# Patient Record
Sex: Male | Born: 1950 | Race: White | Hispanic: No | Marital: Married | Smoking: Former smoker
Health system: Southern US, Community
[De-identification: ages and names within clinical notes are randomized; demographics above are authoritative.]

## PROBLEM LIST (undated history)

## (undated) DIAGNOSIS — G600 Hereditary motor and sensory neuropathy: Secondary | ICD-10-CM

## (undated) DIAGNOSIS — E119 Type 2 diabetes mellitus without complications: Secondary | ICD-10-CM

## (undated) DIAGNOSIS — I10 Essential (primary) hypertension: Secondary | ICD-10-CM

## (undated) DIAGNOSIS — H409 Unspecified glaucoma: Secondary | ICD-10-CM

## (undated) DIAGNOSIS — I509 Heart failure, unspecified: Secondary | ICD-10-CM

## (undated) DIAGNOSIS — I639 Cerebral infarction, unspecified: Secondary | ICD-10-CM

## (undated) HISTORY — PX: TOE AMPUTATION: SHX809

## (undated) HISTORY — PX: EYE SURGERY: SHX253

## (undated) HISTORY — PX: OTHER SURGICAL HISTORY: SHX169

## (undated) HISTORY — PX: CORONARY ARTERY BYPASS GRAFT: SHX141

## (undated) HISTORY — PX: CARDIAC SURGERY: SHX584

---

## 2005-03-22 ENCOUNTER — Inpatient Hospital Stay: Payer: Self-pay | Admitting: Internal Medicine

## 2005-03-22 ENCOUNTER — Other Ambulatory Visit: Payer: Self-pay

## 2005-03-27 ENCOUNTER — Other Ambulatory Visit: Payer: Self-pay

## 2005-03-28 ENCOUNTER — Ambulatory Visit: Payer: Self-pay | Admitting: Urology

## 2005-04-08 ENCOUNTER — Ambulatory Visit: Payer: Self-pay | Admitting: Urology

## 2005-04-18 ENCOUNTER — Ambulatory Visit: Payer: Self-pay | Admitting: Urology

## 2005-10-31 ENCOUNTER — Emergency Department: Payer: Self-pay | Admitting: General Practice

## 2005-11-01 ENCOUNTER — Other Ambulatory Visit: Payer: Self-pay

## 2005-11-01 ENCOUNTER — Inpatient Hospital Stay: Payer: Self-pay

## 2008-10-24 ENCOUNTER — Emergency Department: Payer: Self-pay | Admitting: Emergency Medicine

## 2010-05-24 ENCOUNTER — Other Ambulatory Visit: Payer: Self-pay | Admitting: Ophthalmology

## 2010-12-25 ENCOUNTER — Emergency Department: Payer: Self-pay | Admitting: Emergency Medicine

## 2012-01-07 ENCOUNTER — Observation Stay: Payer: Self-pay | Admitting: Internal Medicine

## 2012-01-07 LAB — BASIC METABOLIC PANEL
Anion Gap: 9 (ref 7–16)
BUN: 22 mg/dL — ABNORMAL HIGH (ref 7–18)
Calcium, Total: 9.4 mg/dL (ref 8.5–10.1)
Chloride: 92 mmol/L — ABNORMAL LOW (ref 98–107)
EGFR (African American): 58 — ABNORMAL LOW
EGFR (Non-African Amer.): 50 — ABNORMAL LOW
Glucose: 277 mg/dL — ABNORMAL HIGH (ref 65–99)
Osmolality: 281 (ref 275–301)
Potassium: 4.1 mmol/L (ref 3.5–5.1)

## 2012-01-07 LAB — CBC
HCT: 48.9 % (ref 40.0–52.0)
MCH: 33.1 pg (ref 26.0–34.0)
MCV: 94 fL (ref 80–100)
Platelet: 131 10*3/uL — ABNORMAL LOW (ref 150–440)
RDW: 13.2 % (ref 11.5–14.5)

## 2012-01-07 LAB — HEPATIC FUNCTION PANEL A (ARMC)
Alkaline Phosphatase: 104 U/L (ref 50–136)
Bilirubin, Direct: 0.5 mg/dL — ABNORMAL HIGH (ref 0.00–0.20)

## 2012-01-07 LAB — CK TOTAL AND CKMB (NOT AT ARMC)
CK, Total: 128 U/L (ref 35–232)
CK, Total: 151 U/L (ref 35–232)
CK-MB: 0.6 ng/mL (ref 0.5–3.6)
CK-MB: 0.5 ng/mL (ref 0.5–3.6)

## 2012-01-07 LAB — TSH: Thyroid Stimulating Horm: 1.45 u[IU]/mL

## 2012-01-07 LAB — TROPONIN I: Troponin-I: <0.02 ng/mL

## 2012-01-08 LAB — BASIC METABOLIC PANEL
Anion Gap: 5 — ABNORMAL LOW (ref 7–16)
Co2: 34 mmol/L — ABNORMAL HIGH (ref 21–32)
Creatinine: 1.4 mg/dL — ABNORMAL HIGH (ref 0.60–1.30)
EGFR (African American): >60
Glucose: 176 mg/dL — ABNORMAL HIGH (ref 65–99)
Osmolality: 280 (ref 275–301)
Sodium: 136 mmol/L (ref 136–145)

## 2012-01-08 LAB — CBC WITH DIFFERENTIAL/PLATELET
Basophil #: 0.1 10*3/uL (ref 0.0–0.1)
Eosinophil #: 0 10*3/uL (ref 0.0–0.7)
Lymphocyte %: 9.3 %
MCH: 32.2 pg (ref 26.0–34.0)
MCHC: 34.1 g/dL (ref 32.0–36.0)
MCV: 94 fL (ref 80–100)
Monocyte %: 9.4 %
Neutrophil #: 14.2 10*3/uL — ABNORMAL HIGH (ref 1.4–6.5)
Neutrophil %: 80.6 %
Platelet: 161 10*3/uL (ref 150–440)
RBC: 4.43 10*6/uL (ref 4.40–5.90)
RDW: 12.4 % (ref 11.5–14.5)
WBC: 17.5 10*3/uL — ABNORMAL HIGH (ref 3.8–10.6)

## 2012-01-08 LAB — MAGNESIUM: Magnesium: 2.1 mg/dL

## 2012-01-09 LAB — CBC WITH DIFFERENTIAL/PLATELET
Basophil #: 0.1 10*3/uL (ref 0.0–0.1)
Eosinophil #: 0.1 10*3/uL (ref 0.0–0.7)
HCT: 37.8 % — ABNORMAL LOW (ref 40.0–52.0)
Lymphocyte #: 1.8 10*3/uL (ref 1.0–3.6)
Lymphocyte %: 18.8 %
MCV: 94 fL (ref 80–100)
Monocyte %: 9.2 %
Neutrophil %: 70 %
Platelet: 148 10*3/uL — ABNORMAL LOW (ref 150–440)
RBC: 4.02 10*6/uL — ABNORMAL LOW (ref 4.40–5.90)
RDW: 12.5 % (ref 11.5–14.5)

## 2012-01-09 LAB — BASIC METABOLIC PANEL
Anion Gap: 5 — ABNORMAL LOW (ref 7–16)
Calcium, Total: 8.8 mg/dL (ref 8.5–10.1)
Chloride: 99 mmol/L (ref 98–107)
EGFR (Non-African Amer.): 60 — ABNORMAL LOW
Osmolality: 278 (ref 275–301)
Potassium: 4.1 mmol/L (ref 3.5–5.1)

## 2012-02-09 ENCOUNTER — Emergency Department: Payer: Self-pay | Admitting: Emergency Medicine

## 2012-02-09 LAB — BASIC METABOLIC PANEL
BUN: 14 mg/dL (ref 7–18)
Calcium, Total: 9.1 mg/dL (ref 8.5–10.1)
Chloride: 98 mmol/L (ref 98–107)
Co2: 29 mmol/L (ref 21–32)
Creatinine: 1.02 mg/dL (ref 0.60–1.30)
Glucose: 238 mg/dL — ABNORMAL HIGH (ref 65–99)
Osmolality: 280 (ref 275–301)
Potassium: 3.9 mmol/L (ref 3.5–5.1)
Sodium: 136 mmol/L (ref 136–145)

## 2012-02-09 LAB — CBC
MCH: 32.6 pg (ref 26.0–34.0)
MCHC: 35.2 g/dL (ref 32.0–36.0)
MCV: 93 fL (ref 80–100)
Platelet: 120 10*3/uL — ABNORMAL LOW (ref 150–440)
RBC: 4.18 10*6/uL — ABNORMAL LOW (ref 4.40–5.90)
RDW: 12.4 % (ref 11.5–14.5)

## 2012-02-15 LAB — CULTURE, BLOOD (SINGLE)

## 2013-11-24 ENCOUNTER — Ambulatory Visit: Payer: Self-pay | Admitting: Ophthalmology

## 2014-03-25 ENCOUNTER — Ambulatory Visit: Payer: Self-pay | Admitting: Gastroenterology

## 2014-09-06 NOTE — H&P (Signed)
PATIENT NAME:  Mark Armstrong, Mark Armstrong MR#:  [id number] DATE OF BIRTH:  12/23/1950  DATE OF ADMISSION:  01/07/2012  PRIMARY CARE PHYSICIAN: Delton Prairie, MD [facility])  History obtained from the patient, family at bedside, and case discussed with the ER physician. The patient's imaging studies and EKG are reviewed.   CHIEF COMPLAINT: Chest pain.   HISTORY OF PRESENTING ILLNESS: The patient is a 64 year old Caucasian male patient with history of coronary artery disease status post coronary artery bypass graft in 1999 along with congestive heart failure, diabetes, and hypertension who presents to the emergency room brought in by EMS after he complained of acute onset of mid sternal chest pain. The pain lasted two hours and resolved after the patient arrived to the emergency room where he got nitro medication. He mentions that he had one episode of cough and some greenish sputum with his pain. No aggravating or relieving factors for the pain. The pain did radiate to his back. He did not have any abdominal pain, nausea, vomiting, sweating, palpitations, or lightheadedness. The patient mentions that he had similar pain many years back but resolved on its own.   While the patient was being brought to the hospital by EMS, he did have what seems to be 5 to 10 seconds of ventricular tachycardia which was caught on a rhythm strip, but as per ER physician this did run for a longer time than was recorded. Presently the patient's heart rate is between 120 to 130, in normal sinus rhythm, and he is chest pain-free.   The patient used to follow-up with Dr. Gwen Pounds and Dr. Lady Gary of cardiology but has not seen them in over four years. He has not had any cardiology follow-up. He does not remember the last time he had a stress test or echocardiogram. Today his cardiac enzymes are normal. EKG shows no acute ST elevation. The patient is being admitted to the hospitalist service for chest pain, rule out acute coronary syndrome  under observation.   PAST MEDICAL HISTORY:  1. Bell's palsy.  2. Transient ischemic attack/cerebrovascular accident.   3. Hyperlipidemia.  4. Hypertension.  5. Insulin-dependent diabetes mellitus.  6. Peripheral neuropathy.  7. Chronic pain syndrome on high dose opioids.  8. Coronary artery disease status post coronary artery bypass graft in 1999.  9. Congestive heart failure, unknown if systolic or diastolic.  10. Charcot foot.  11. Diverticulosis.  12. Glaucoma.   PAST SURGICAL HISTORY:  1. Glaucoma surgery. 2. Knee surgery. 3. Circumcision. 4. Surgery for fracture of right foot.   ALLERGIES: Penicillin.   SOCIAL HISTORY: The patient lives at home with his wife. He has 20 pack-year smoking history but quit 20 years back. No alcohol and no illicit drugs. Ambulates on his own.   CODE STATUS: FULL CODE.   FAMILY HISTORY: Positive for congestive heart failure, chronic obstructive pulmonary disease, and diabetes in his father. Mother had diabetes and coronary artery disease and had coronary bypass.   HOME MEDICATIONS:  1. Allopurinol 100 mg oral once a day.  2. Amitriptyline 150 mg oral once a day.  3. Flexeril 10 mg one tablet orally as needed.  4. Ferrous sulfate 25 mg orally once a day.  5. Fish oil 1000 mg two capsules oral once a day.  6. Gemfibrozil 600 mg oral three times daily. 7. Glipizide 10 mg oral two tablets once a day.  8. Hydrocortisone topical application rectally as needed.  9. Lantus 50 units subcutaneous once a day.  10.  Lasix 40 mg orally one to two times a day.  11. Lisinopril 5 mg oral once a day.  12. Lyrica 100 mg oral three times daily.  13. Metformin 1000 mg oral twice a day. 14. MiraLax 1 capsule orally once a day.  15. Nexium 40 mg orally once a day.  16. Oxycodone extended-release 50 mg orally twice a day. 17. Percocet 5/325 mg one tablet oral four times daily. 18. Potassium citrate three tablets orally twice a day. 19. Simvastatin 80 mg  orally once a day.  20. Timolol 0.5% ophthalmic solution in the affected eye twice a day.  21. Vitamin C 500 mg oral twice a day.  22. Zolpidem 10 mg orally once a day at bedtime.   REVIEW OF THE SYSTEMS: CONSTITUTIONAL: Complains of no fatigue, weight loss or weight gain. EYES: No redness or pain. Does have history of glaucoma. ENT: Has some nasal congestion. No tinnitus or hearing loss. CARDIOVASCULAR: Complains of chest pain. No edema, PND, or orthopnea. RESPIRATORY: Had one episode of cough and greenish sputum. GASTROINTESTINAL: No abdominal pain, nausea, vomiting or diarrhea. MUSCULOSKELETAL: Has no arthritis or joint pains. SKIN: Has stasis changes in both his legs. No other ulcers. NEUROLOGIC: Has peripheral neuropathy. No focal weakness. HEMATOLOGIC/LYMPHATIC: No anemia or bleeding.   PHYSICAL EXAMINATION:   VITAL SIGNS: Temperature 98, pulse 128, respirations 16, blood pressure 122/94, and saturating 97% on room air.   GENERAL: Obese Caucasian male patient lying in bed comfortable, in no acute distress, conversational, cooperative with exam.   PSYCH: Alert and oriented x3. Mood and affect appropriate. Judgment intact.   HEENT: Atraumatic, normocephalic. Oral mucosa moist and pink. External ears and nose normal. No pallor. No icterus. Pupils are bilaterally equal and reactive to light.   NECK: Supple. No thyromegaly. No palpable lymph nodes. Trachea midline. No carotid bruit or JVD.   CARDIOVASCULAR: No chest wall tenderness. S1 and S2 heard without any murmurs. Peripheral pulses 2+. Has 1+ edema in the lower extremities.   RESPIRATORY: Good air entry on both sides. Normal work of breathing. Clear to auscultation on both sides.   GASTROINTESTINAL: Soft abdomen, nontender. Bowel sounds present. No hepatosplenomegaly palpable.   MUSCULOSKELETAL: No joint swelling, redness, or effusion of the large joints. Has a Charcot foot on the right side.   SKIN: Stasis changes in the lower  extremities with dry skin and excoriations. Warm and dry.   NEUROLOGICAL: Motor strength 5 out of 5 in upper extremities. Decreased sensation. Cranial nerves II through XII intact.   LABS/RADIOLOGIC STUDIES: Glucose 277, BUN 22, creatinine 1.49, sodium 134, potassium 4.1, and bicarbonate 33. GFR 50. CK 128, CK-MB 0.6, and troponin less than 0.02. TSH 1.45. WBC 8.7, hemoglobin 17.2, and platelets 131.   EKG shows inferolateral Q waves with poor R wave progression, normal sinus rhythm, and heart rate of 127.   CT scan of the abdomen and pelvis showed right-sided nephrolithiasis without hydronephrosis, cholelithiasis, and indeterminate 7 mm nodule in the left lower lobe similar to prior study along with heterogenous opacity in lower lungs concerning for pneumonia or aspiration.   ASSESSMENT AND PLAN:  1. Atypical chest pain: The patient does complain of two hours of chest pain, which has resolved at this time. No clear etiology. Need to rule out acute coronary syndrome in this patient with significant coronary artery disease and no follow-up and the patient does not seem to have a stress test or any follow up since his bypass. We will get two more  sets of cardiac enzymes. The patient did see Dr. Gwen Pounds in the past. We will consult cardiology for further input. We will start the patient on low-dose beta blocker. The patient is on a statin with his history of coronary artery disease.  2. Nonsustained ventricular tachycardia: The patient does have normal electrolytes. We will need to place the patient on a telemetry floor. We will get a 2-D echocardiogram to assess LV function. The patient will be placed on a low-dose beta blocker.  3. Hypertension, well controlled: Continue medications.  4. Insulin-dependent diabetes mellitus: Continue the patient's insulin, add sliding scale insulin and diabetic diet.  5. Chronic congestive heart failure, unknown systolic or diastolic: The patient will be getting a 2-D  echocardiogram during the hospital stay.  6. Chronic kidney stage III, stable.  7. Pulmonary nodules, stable from prior study: Needs outpatient follow-up.  8. Cholelithiasis: The patient does not complain of any abdominal pain at this time. He needs to have further work-up if he does develop any abdominal pain or signs of gallbladder dysfunction.  9. Deep vein thrombosis prophylaxis with heparin.   CODE STATUS: FULL CODE.   TIME SPENT: Time spent today on this case was 55 minutes with more than 50% time spent in coordination of care. ____________________________ Molinda Bailiff. Sabriyah Wilcher, MD srs:slb D: 01/07/2012 14:52:11 ET T: 01/07/2012 15:29:49 ET JOB#: [id number]  cc: Wardell Heath R. Ruel Dimmick, MD, <Dictator> Vic Ripper. Mariana Kaufman, MD Orie Fisherman MD ELECTRONICALLY SIGNED 01/07/2012 21:53

## 2014-09-06 NOTE — Consult Note (Signed)
PATIENT NAME:  Mark Armstrong, [place]LAND MR#:  [id number] DATE OF BIRTH:  Jun 16, 1950  DATE OF CONSULTATION:  01/07/2012  REFERRING PHYSICIAN:  Milagros Loll, MD CONSULTING PHYSICIAN:  Marcina Millard, MD  PRIMARY CARE PHYSICIAN: Delton Prairie, MD  CHIEF COMPLAINT: "I thought I had a kidney stone."  REASON FOR CONSULTATION: Consultation is requested for evaluation of coronary artery disease and ventricular tachycardia.   HISTORY OF PRESENT ILLNESS: The patient is a 64 year old gentleman with known coronary artery disease status post bypass graft surgery with ischemic cardiomyopathy and history of congestive heart failure. The patient reports that early this morning he was experiencing symptoms that he felt like were similar to what he experienced previously with kidney stones. He denies chest pain. EMS was called. Rhythm strip revealed wide complex tachycardia, which was brief in nature. The patient was brought to the [facility] Emergency Room where EKG did not reveal any acute ischemic ST-T wave changes. Initial troponin was less than 0.02. The patient currently denies chest pain or shortness of breath.   PAST MEDICAL HISTORY:  1. Coronary artery disease status post bypass graft surgery at [facility] in 1999. 2. History of recurrent congestive heart failure.  3. Apparent ischemic cardiomyopathy.  4. Hypertension.  5. Hyperlipidemia.  6. Insulin requiring diabetes.  7. Peripheral neuropathy. 8. History of transient ischemic attack and prior cerebrovascular accident. 9. Charcot foot. 10. Bell's palsy.     MEDICATIONS:  1. Gemfibrozil 600 mg three times daily. 2. Lasix 40 mg 1 to 2 twice a day. 3. Lisinopril 5 mg daily. 4. Potassium 3 tablets twice a day. 5. Simvastatin 80 mg daily.  6. Allopurinol 100 mg daily.  7. Amitriptyline 150 mg daily.  8. Flexeril 10 mg daily p.r.n.  9. Ferrous sulfate 25 mg daily.  10. Fish oil capsule 1000 mg 2 capsules  daily.  11. Glipizide 20 mg daily.  12. Lantus 50 units subcutaneous daily. 13. Lyrica 100 mg three times daily. 14. Metformin 1000 mg twice a day. 15. MiraLax 1 capsule daily.  16. Nexium 40 mg daily.  17. Oxycodone 50 mg twice a day. 18. Percocet one four times daily.  19. Timolol eyedrops. 20. Vitamin C 500 mg twice a day. 21. Zolpidem 10 mg at bedtime.   SOCIAL HISTORY: The patient is married and resides with his wife. He has a 20 pack-year tobacco abuse history. His mother with advanced Alzheimer's lives at home.   FAMILY HISTORY: Notable for mother with bypass graft surgery.   REVIEW OF SYSTEMS: CONSTITUTIONAL: No fever or chills. EYES: No blurry vision. EARS: No hearing loss. RESPIRATORY: No shortness of breath. CARDIOVASCULAR: No chest pain. GASTROINTESTINAL: No nausea, vomiting, diarrhea, or constipation. GU: No dysuria or hematuria, although the patient had flank discomfort with weakness and dizziness which is similar to what he experienced with prior kidney stones. MUSCULOSKELETAL: No arthralgias or myalgias. INTEGUMENTARY: No rash. NEUROLOGICAL: No focal muscle weakness or numbness. The patient does have neuropathy in both feet. PSYCHOLOGICAL: No depression or anxiety.     PHYSICAL EXAMINATION:   VITAL SIGNS: Blood pressure 102/67, pulse 100 and irregular, respirations 20, temperature 100.4, and pulse oximetry 94%.   HEENT: Pupils are equal and reactive to light and accommodation.   NECK: Supple without thyromegaly.   LUNGS: Clear.   HEART: Normal jugular venous pressure. Normal point of maximal impulse. Regular rate and rhythm. Normal S1 and S2. No appreciable gallop, murmur, or rub.   ABDOMEN: Soft and nontender.  EXTREMITIES: There is 1 to 2+ bilateral pedal edema with venous stasis changes on both lower extremities.   MUSCULOSKELETAL: Normal muscle tone.   NEUROLOGIC: The patient is alert and oriented x3. Motor and sensory are both grossly intact.    IMPRESSION: A 64 year old gentleman with known coronary artery disease status post bypass graft surgery with ischemic cardiomyopathy who presents with symptoms reflective of prior kidney stones and has ruled out for myocardial infarction by CPK isoenzymes and troponin. The patient did have a rhythm strip which revealed wide complex tachycardia, likely ventricular tachycardia that was nonsustained. The patient has had no prior history of presyncope or syncope.   RECOMMENDATIONS:  1. Continue present medications. 2. Defer full dose anticoagulation.  3. Agree with metoprolol. 4. Lexiscan sestamibi study to rule out myocardial ischemia.      5. 2-D echocardiogram to evaluate left ventricular function.  6. Further recommendations pending Lexiscan sestamibi results and echocardiogram.  ____________________________ Marcina MillardAlexander Cyndie Woodbeck, MD ap:slb D: 01/07/2012 17:12:50 ET T: 01/07/2012 17:43:58 ET JOB#: [id number]  cc: Marcina MillardAlexander Else Habermann, MD, <Dictator> Marcina MillardALEXANDER Sophi Calligan MD ELECTRONICALLY SIGNED 01/22/2012 9:00

## 2014-09-06 NOTE — Consult Note (Signed)
Brief Consult Note: Diagnosis: Vague CP, patient actually denies CP, WCT, probable nonsustained VY, no h/o syncope.   Patient was seen by consultant.   Consult note dictated.   Comments: REC  Agree with current therapy, defer full dose anticoagulation, Lexi-scan Myoview, echo.  Electronic Signatures: Marcina MillardParaschos, Joanie Duprey (MD)  (Signed 20-Aug-13 17:14)  Authored: Brief Consult Note   Last Updated: 20-Aug-13 17:14 by Marcina MillardParaschos, Tyrea Froberg (MD)

## 2014-09-06 NOTE — Discharge Summary (Signed)
PATIENT NAME:  Mark Armstrong, Mark Armstrong MR#:  [id number] DATE OF BIRTH:  11-Dec-1950  DATE OF ADMISSION:  01/07/2012 DATE OF DISCHARGE:  01/09/2012  PRIMARY CARE PHYSICIAN: None local  CONSULTING PHYSICIAN:   Marcina MillardAlexander Paraschos, MD    DISCHARGE DIAGNOSES:  1. Atypical chest pain. 2. Dehydration with chronic kidney disease, stage 3.  3. Cholelithiasis and possible cholecystitis.  4. Congestive heart failure with possible diastolic dysfunction, stable.  5. Hypertension.  6. Diabetes.  7. Right side nephrolithiasis.   PROCEDURES: None.   CONDITION: Stable.   DISCHARGE MEDICATIONS: [facility] Phys[facility].  NOTE: Stop the following medications:  1. Potassium 3 tablets p.o. b.i.Armstrong.  2. Lisinopril 5 mg p.o. daily.  3. Lasix 40 mg p.o. 1 to 2 times a day. 4. Percocet 5/325 mg p.o. q.i.Armstrong.   The reason for stopping these medications is low blood pressure.   DIET: Low sodium, low fat, low cholesterol ADA diet.   ACTIVITY: As tolerated.    FOLLOW-UP CARE:  1. Follow up with primary care physician within 1 week.  2. Follow up with Dr. Darrold JunkerParaschos within 1 to 2 weeks   REASON FOR ADMISSION: Chest pain.   HOSPITAL COURSE: The patient is a 64 year old Caucasian male with a history of coronary artery disease, status post CABG, congestive heart failure, diabetes, hypertension, presented to the Emergency Department due to acute onset of mild substernal chest pain for two hours with radiation to back. In addition, the patient's heart rate was about 120 to 130. EKG showed no acute ST elevation. Cardiac enzymes were normal. For a detailed History and Physical Examination, please refer to the admission note dictated by Dr. Elpidio AnisSudini. The patient was admitted for:  1. Atypical chest pain: CT scan of the abdomen and pelvis showed right side nephrolithiasis without hydronephrosis, cholelithiasis. After admission, the patient has been treated with a low dose beta blocker.  Echocardiogram showed normal ejection fraction. Stress test was normal, so acute coronary syndrome was ruled out.  2. Possible cholecystitis: After admission, the patient had no chest pain but complained of right upper quadrant tenderness and right flank pain which was mild. In addition, the patient's WBC  increased to 17.5, suspect cholecystitis; so we ordered a HIDA scan, but the HIDA scan could not be done until tomorrow. We started Levaquin yesterday. The patient's white count decreased to normal range today. In addition, the patient has no chest pain or abdominal pain, nausea or vomiting.  3. Low blood pressure: Since the patient's blood pressure is on the lower side, we held Lasix, lisinopril and potassium. The patient's blood pressure is about 100, but the patient has no complaints.   The patient is clinically stable and will be discharged to home today. The patient needs to follow up with his primary care physician for possible cholecystitis work-up, including HIDA scan. In addition, the patient may resume hypertension medications depending on the patient's blood pressure, but he needs follow up with his primary care physician.   I discussed the patient's discharge plan with the patient, the patient's wife, nurse, and the case manager.      TIME SPENT: About 42 minutes.  ____________________________ Shaune PollackQing Elfriede Bonini, MD qc:cbb Armstrong: 01/09/2012 17:14:20 ET T: 01/10/2012 10:51:10 ET JOB#: [id number]  cc: Shaune PollackQing Letisia Schwalb, MD, <Dictator> Shaune PollackQING Janice Bodine MD ELECTRONICALLY SIGNED 01/14/2012 16:42

## 2014-12-14 DIAGNOSIS — Y9389 Activity, other specified: Secondary | ICD-10-CM | POA: Insufficient documentation

## 2014-12-14 DIAGNOSIS — W260XXA Contact with knife, initial encounter: Secondary | ICD-10-CM | POA: Insufficient documentation

## 2014-12-14 DIAGNOSIS — S61211A Laceration without foreign body of left index finger without damage to nail, initial encounter: Secondary | ICD-10-CM | POA: Diagnosis not present

## 2014-12-14 DIAGNOSIS — Y9289 Other specified places as the place of occurrence of the external cause: Secondary | ICD-10-CM | POA: Insufficient documentation

## 2014-12-14 DIAGNOSIS — Y998 Other external cause status: Secondary | ICD-10-CM | POA: Insufficient documentation

## 2014-12-14 DIAGNOSIS — Z23 Encounter for immunization: Secondary | ICD-10-CM | POA: Diagnosis not present

## 2014-12-14 NOTE — ED Notes (Addendum)
Patient ambulatory to triage with steady gait, without difficulty or distress noted; pt reports slicing cookie dough and cut left index finger; approx 1" lac noted with active bleeding; saline soaked gauze applied with pressure

## 2014-12-15 ENCOUNTER — Emergency Department
Admission: EM | Admit: 2014-12-15 | Discharge: 2014-12-15 | Disposition: A | Discharge status: Home / Self Care | Payer: Medicare Other | Attending: Emergency Medicine | Admitting: Emergency Medicine

## 2014-12-15 DIAGNOSIS — S61211A Laceration without foreign body of left index finger without damage to nail, initial encounter: Secondary | ICD-10-CM

## 2014-12-15 MED ORDER — TETANUS-DIPHTH-ACELL PERTUSSIS 5-2.5-18.5 LF-MCG/0.5 IM SUSP
0.5000 mL | Freq: Once | INTRAMUSCULAR | Status: AC
  Administered 2014-12-15: 0.5 mL via INTRAMUSCULAR
  Filled 2014-12-15: qty 0.5

## 2014-12-15 MED ORDER — DOXYCYCLINE HYCLATE 100 MG PO CAPS: 100.0000 mg | ORAL_CAPSULE | Freq: Two times a day (BID) | ORAL | Status: DC

## 2014-12-15 MED ORDER — LIDOCAINE HCL (PF) 1 % IJ SOLN
INTRAMUSCULAR | Status: AC
  Administered 2014-12-15: 5 mL
  Filled 2014-12-15: qty 5

## 2014-12-15 NOTE — ED Notes (Signed)
Right index finger sutured and dressed with gauze.

## 2014-12-15 NOTE — ED Provider Notes (Signed)
[facility] Emergency Department Provider Note  ____________________________________________  Time seen: Approximately 1:42 AM  I have reviewed the triage vital signs and the nursing notes.   HISTORY  Chief Complaint Laceration    HPI Mark Armstrong is a 64 y.o. male who was using a knife to cut food at about 11 PM when he sliced the tip of his left index finger. No pain. He states he has neuropathy in his hand and does not have sensation and then the tips of any of his fingers anymore. No other injury. States it bled a fair amount, but is now improved. His last tetanus is unknown.  No other injury. He did recently cut his hand with a knife last week which his wife was able to bandage for him and states this is healed up now.  Relevant that the patient doesn't history of diabetes and neuropathy in the tips of all fingers.  No past medical history on file.  There are no active problems to display for this patient.   No past surgical history on file.  Current Outpatient Rx  Name  Route  Sig  Dispense  Refill  . doxycycline (VIBRAMYCIN) 100 MG capsule   Oral   Take 1 capsule (100 mg total) by mouth 2 (two) times daily.   20 capsule   0     Allergies Penicillins  No family history on file.  Social History History  Substance Use Topics  . Smoking status: Not on file  . Smokeless tobacco: Not on file  . Alcohol Use: Not on file    Review of Systems Constitutional: No fever/chills Eyes: No visual changes. ENT: No sore throat. Cardiovascular: Denies chest pain. Respiratory: Denies shortness of breath. Gastrointestinal: No abdominal pain.  No nausea, no vomiting.  No diarrhea.  No constipation. Genitourinary: Negative for dysuria. Musculoskeletal: Negative for back pain. Skin: See history of present illness Neurological: Negative for headaches, focal weakness or numbness.  10-point ROS otherwise  negative.  ____________________________________________   PHYSICAL EXAM:  VITAL SIGNS: ED Triage Vitals  Enc Vitals Group     BP 12/14/14 2344 137/62 mmHg     Pulse Rate 12/14/14 2344 52     Resp 12/14/14 2344 18     Temp 12/14/14 2344 98.3 F (36.8 C)     Temp Source 12/14/14 2344 Oral     SpO2 --      Weight 12/14/14 2344 199 lb (90.266 kg)     Height 12/14/14 2344  (1.854 m)     Head Cir --      Peak Flow --      Pain Score 12/14/14 2345 10     Pain Loc --      Pain Edu? --      Excl. in GC? --     Constitutional: Alert and oriented. Well appearing and in no acute distress. Eyes: Conjunctivae are normal. PERRL. EOMI. Head: Atraumatic. Nose: No congestion/rhinnorhea. Mouth/Throat: Mucous membranes are moist.  Oropharynx non-erythematous. Cardiovascular: Normal rate, regular rhythm.  Respiratory: Normal respiratory effort.  No retractions. Lungs CTAB. Gastrointestinal: No distention Musculoskeletal: Right hand atraumatic normal exam. Left hand atraumatic with normal exam except for a 1-1/2 cm laceration into the dermis over the distal tuft distal to the DIP. No nail bed injury. Bleeding is controlled with pressure. Patient was then taken and performed Water irrigation for approximately 5 minutes. The patient has no sensation in the distal tip, but this is true of all digits  and this is stable from neuropathy. Good flexion and extension of the left index finger. No other injury that hand. Capillary refill normal. Normal radial pulse. Median ulnar and radial motor sensory intact with exception to distal neuropathy. No joint effusions. Neurologic:  Normal speech and language. No gross focal neurologic deficits are appreciated. No gait instability. Skin:  Skin is warm, dry and intact. No rash noted. Psychiatric: Mood and affect are normal. Speech and behavior are normal.  ____________________________________________   LABS (all labs ordered are listed, but only abnormal  results are displayed)  Labs Reviewed - No data to display ____________________________________________  EKG   ____________________________________________  RADIOLOGY   ____________________________________________   PROCEDURES  Procedure(s) performed:  LACERATION REPAIR Performed by: Sharyn Creamer Authorized by: Sharyn Creamer Consent: Verbal consent obtained. Risks and benefits: risks, benefits and alternatives were discussed Consent given by: patient Patient identity confirmed: provided demographic data Prepped and Draped in normal sterile fashion Wound explored  Laceration Location: Left index hand  Laceration Length: 1.5 cm  No Foreign Bodies seen or palpated  No anesthesia was required as the patient had no sensation due to his existing neuropathy   Amount of cleaning: standard with pressure saline at bedside plus five-minute tap water irrigation   Skin closure: 4-0 prolene   Number of sutures: 6  Technique: Simple interrupted   Patient tolerance: Patient tolerated the procedure well with no immediate complications.  Injury explored and no foreign body identified. No tendinous injuries.  Critical Care performed: No  ____________________________________________   INITIAL IMPRESSION / ASSESSMENT AND PLAN / ED COURSE  Pertinent labs & imaging results that were available during my care of the patient were reviewed by me and considered in my medical decision making (see chart for details).  Skin lac distal left index. No other injury. Repaired.  Return precautions discussed and placed patient on doxycycline and updated tetanus. He will follow-up with his doctor and he and his wife were advised of wound care as well as close treatment recommendations and careful return precautions as he is a diabetic and is at risk for reinjury and infection despite significant wound care. ____________________________________________   FINAL CLINICAL IMPRESSION(S) / ED  DIAGNOSES  Final diagnoses:  Laceration of left index finger w/o foreign body w/o damage to nail, initial encounter      Sharyn Creamer, MD 12/15/14 [phone]

## 2014-12-15 NOTE — Discharge Instructions (Signed)
Laceration Care, Adult  You have been seen in the Emergency Department (ED) today for a laceration (cut).  Please keep the cut clean but do not submerge it in the water.  It has been repaired with staples or sutures that will need to be removed in about 7 to 10 days. Please follow up with your doctor, an urgent care, or return to the ED for suture removal.     Please follow up with your doctor as soon as possible regarding today's emergent visit.   Return to the ED or call your doctor if you notice any signs of infection such as fever, increased pain, increased redness, pus, or other symptoms that concern you.   A laceration is a cut or lesion that goes through all layers of the skin and into the tissue just beneath the skin. TREATMENT  Some lacerations may not require closure. Some lacerations may not be able to be closed due to an increased risk of infection. It is important to see your caregiver as soon as possible after an injury to minimize the risk of infection and maximize the opportunity for successful closure. If closure is appropriate, pain medicines may be given, if needed. The wound will be cleaned to help prevent infection. Your caregiver will use stitches (sutures), staples, wound glue (adhesive), or skin adhesive strips to repair the laceration. These tools bring the skin edges together to allow for faster healing and a better cosmetic outcome. However, all wounds will heal with a scar. Once the wound has healed, scarring can be minimized by covering the wound with sunscreen during the day for 1 full year. HOME CARE INSTRUCTIONS  For sutures or staples:  Keep the wound clean and dry.  If you were given a bandage (dressing), you should change it at least once a day. Also, change the dressing if it becomes wet or dirty, or as directed by your caregiver.  Wash the wound with soap and water 2 times a day. Rinse the wound off with water to remove all soap. Pat the wound dry with a  clean towel.  After cleaning, apply a thin layer of the antibiotic ointment as recommended by your caregiver. This will help prevent infection and keep the dressing from sticking.  You may shower as usual after the first 24 hours. Do not soak the wound in water until the sutures are removed.  Only take over-the-counter or prescription medicines for pain, discomfort, or fever as directed by your caregiver.  Get your sutures or staples removed as directed by your caregiver. For skin adhesive strips:  Keep the wound clean and dry.  Do not get the skin adhesive strips wet. You may bathe carefully, using caution to keep the wound dry.  If the wound gets wet, pat it dry with a clean towel.  Skin adhesive strips will fall off on their own. You may trim the strips as the wound heals. Do not remove skin adhesive strips that are still stuck to the wound. They will fall off in time. For wound adhesive:  You may briefly wet your wound in the shower or bath. Do not soak or scrub the wound. Do not swim. Avoid periods of heavy perspiration until the skin adhesive has fallen off on its own. After showering or bathing, gently pat the wound dry with a clean towel.  Do not apply liquid medicine, cream medicine, or ointment medicine to your wound while the skin adhesive is in place. This may loosen the film before  your wound is healed.  If a dressing is placed over the wound, be careful not to apply tape directly over the skin adhesive. This may cause the adhesive to be pulled off before the wound is healed.  Avoid prolonged exposure to sunlight or tanning lamps while the skin adhesive is in place. Exposure to ultraviolet light in the first year will darken the scar.  The skin adhesive will usually remain in place for 5 to 10 days, then naturally fall off the skin. Do not pick at the adhesive film. You may need a tetanus shot if:  You cannot remember when you had your last tetanus shot.  You have never  had a tetanus shot. If you get a tetanus shot, your arm may swell, get red, and feel warm to the touch. This is common and not a problem. If you need a tetanus shot and you choose not to have one, there is a rare chance of getting tetanus. Sickness from tetanus can be serious. SEEK MEDICAL CARE IF:   You have redness, swelling, or increasing pain in the wound.  You see a red line that goes away from the wound.  You have yellowish-white fluid (pus) coming from the wound.  You have a fever.  You notice a bad smell coming from the wound or dressing.  Your wound breaks open before or after sutures have been removed.  You notice something coming out of the wound such as wood or glass.  Your wound is on your hand or foot and you cannot move a finger or toe. SEEK IMMEDIATE MEDICAL CARE IF:   Your pain is not controlled with prescribed medicine.  You have severe swelling around the wound causing pain and numbness or a change in color in your arm, hand, leg, or foot.  Your wound splits open and starts bleeding.  You have worsening numbness, weakness, or loss of function of any joint around or beyond the wound.  You develop painful lumps near the wound or on the skin anywhere on your body. MAKE SURE YOU:   Understand these instructions.  Will watch your condition.  Will get help right away if you are not doing well or get worse. Document Released: 05/06/2005 Document Revised: 07/29/2011 Document Reviewed: 10/30/2010 [facility] Patient Information 2015 [place], [place]. This information is not intended to replace advice given to you by your health care provider. Make sure you discuss any questions you have with your health care provider.

## 2014-12-15 NOTE — ED Notes (Signed)
PT HAS LACERATION TO LEFT INDEX FINGER.  CUT FINGER WITH A KNIFE TONIGHT.  BLEEDING CONTROLLED.

## 2015-03-10 ENCOUNTER — Emergency Department: Payer: Medicare Other

## 2015-03-10 ENCOUNTER — Encounter: Payer: Self-pay | Admitting: Emergency Medicine

## 2015-03-10 ENCOUNTER — Emergency Department
Admission: EM | Admit: 2015-03-10 | Discharge: 2015-03-10 | Disposition: A | Discharge status: Home / Self Care | Payer: Medicare Other | Attending: Emergency Medicine | Admitting: Emergency Medicine

## 2015-03-10 DIAGNOSIS — I10 Essential (primary) hypertension: Secondary | ICD-10-CM | POA: Diagnosis not present

## 2015-03-10 DIAGNOSIS — Y9289 Other specified places as the place of occurrence of the external cause: Secondary | ICD-10-CM | POA: Insufficient documentation

## 2015-03-10 DIAGNOSIS — S3992XA Unspecified injury of lower back, initial encounter: Secondary | ICD-10-CM | POA: Diagnosis not present

## 2015-03-10 DIAGNOSIS — Z87891 Personal history of nicotine dependence: Secondary | ICD-10-CM | POA: Diagnosis not present

## 2015-03-10 DIAGNOSIS — S0990XA Unspecified injury of head, initial encounter: Secondary | ICD-10-CM | POA: Diagnosis present

## 2015-03-10 DIAGNOSIS — T148XXA Other injury of unspecified body region, initial encounter: Secondary | ICD-10-CM

## 2015-03-10 DIAGNOSIS — S50312A Abrasion of left elbow, initial encounter: Secondary | ICD-10-CM | POA: Insufficient documentation

## 2015-03-10 DIAGNOSIS — E119 Type 2 diabetes mellitus without complications: Secondary | ICD-10-CM | POA: Insufficient documentation

## 2015-03-10 DIAGNOSIS — S0003XA Contusion of scalp, initial encounter: Secondary | ICD-10-CM

## 2015-03-10 DIAGNOSIS — Y9389 Activity, other specified: Secondary | ICD-10-CM | POA: Insufficient documentation

## 2015-03-10 DIAGNOSIS — W108XXA Fall (on) (from) other stairs and steps, initial encounter: Secondary | ICD-10-CM | POA: Diagnosis not present

## 2015-03-10 DIAGNOSIS — S79911A Unspecified injury of right hip, initial encounter: Secondary | ICD-10-CM | POA: Diagnosis not present

## 2015-03-10 DIAGNOSIS — Z880 Allergy status to penicillin: Secondary | ICD-10-CM | POA: Diagnosis not present

## 2015-03-10 DIAGNOSIS — Y998 Other external cause status: Secondary | ICD-10-CM | POA: Diagnosis not present

## 2015-03-10 HISTORY — DX: Unspecified glaucoma: H40.9

## 2015-03-10 HISTORY — DX: Heart failure, unspecified: I50.9

## 2015-03-10 HISTORY — DX: Hereditary motor and sensory neuropathy: G60.0

## 2015-03-10 HISTORY — DX: Type 2 diabetes mellitus without complications: E11.9

## 2015-03-10 HISTORY — DX: Cerebral infarction, unspecified: I63.9

## 2015-03-10 HISTORY — DX: Essential (primary) hypertension: I10

## 2015-03-10 MED ORDER — BACITRACIN ZINC 500 UNIT/GM EX OINT
TOPICAL_OINTMENT | CUTANEOUS | Status: AC
  Filled 2015-03-10: qty 0.9

## 2015-03-10 MED ORDER — OXYCODONE-ACETAMINOPHEN 5-325 MG PO TABS
1.0000 | ORAL_TABLET | Freq: Once | ORAL | Status: AC
  Administered 2015-03-10: 1 via ORAL
  Filled 2015-03-10: qty 1

## 2015-03-10 NOTE — ED Notes (Signed)
Unable to pull up pt's discharge signature page, pt was not able to sign

## 2015-03-10 NOTE — Discharge Instructions (Signed)
Facial or Scalp Contusion ° A facial or scalp contusion is a deep bruise on the face or head. Contusions happen when an injury causes bleeding under the skin. Signs of bruising include pain, puffiness (swelling), and discolored skin. The contusion may turn blue, purple, or yellow. °HOME CARE °· Only take medicines as told by your doctor. °· Put ice on the injured area. °¨ Put ice in a plastic bag. °¨ Place a towel between your skin and the bag. °¨ Leave the ice on for 20 minutes, 2-3 times a day. °GET HELP IF: °· You have bite problems. °· You have pain when chewing. °· You are worried about your face not healing normally. °GET HELP RIGHT AWAY IF:  °· You have severe pain or a headache and medicine does not help. °· You are very tired or confused, or your personality changes. °· You throw up (vomit). °· You have a nosebleed that will not stop. °· You see two of everything (double vision) or have blurry vision. °· You have fluid coming from your nose or ear. °· You have problems walking or using your arms or legs. °MAKE SURE YOU:  °· Understand these instructions. °· Will watch your condition. °· Will get help right away if you are not doing well or get worse. °  °This information is not intended to replace advice given to you by your health care provider. Make sure you discuss any questions you have with your health care provider. °  °Document Released: 04/25/2011 Document Revised: 05/27/2014 Document Reviewed: 12/17/2012 °Elsevier Interactive Patient Education ©2016 [employer] ° °

## 2015-03-10 NOTE — ED Provider Notes (Signed)
[facility] Emergency Department Provider Note  ____________________________________________  Time seen:   I have reviewed the triage vital signs and the nursing notes.   HISTORY  Chief Complaint Fall; Head Injury; and Back Pain     HPI Mark Armstrong is a 64 y.o. male presents with history of accidental fall down approximately 6 steps while chasing his cat tonight. Patient states that he struck his head during the fall however did not have any loss of consciousness. Patient admits to abrasion to his left elbow. In addition patient has right hip and low back pain.     Past Medical History  Diagnosis Date  . Stroke (HCC)   . CHF (congestive heart failure) (HCC)   . Diabetes mellitus without complication (HCC)   . Hypertension   . 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 Glaucoma     There are no active problems to display for this patient.   Past Surgical History  Procedure Laterality Date  . Cardiac surgery    . Eye surgery      Current Outpatient Rx  Name  Route  Sig  Dispense  Refill  . doxycycline (VIBRAMYCIN) 100 MG capsule   Oral   Take 1 capsule (100 mg total) by mouth 2 (two) times daily.   20 capsule   0     Allergies Penicillins  History reviewed. No pertinent family history.  Social History Social History  Substance Use Topics  . Smoking status: Former Games developermoker  . Smokeless tobacco: Never Used  . Alcohol Use: No    Review of Systems  Constitutional: Negative for fever. Eyes: Negative for visual changes. ENT: Negative for sore throat. Cardiovascular: Negative for chest pain. Respiratory: Negative for shortness of breath. Gastrointestinal: Negative for abdominal pain, vomiting and diarrhea. Genitourinary: Negative for dysuria. Musculoskeletal: Positive for back pain. Skin: Negative for rash. Neurological: Negative for headaches, focal weakness or numbness.   10-point ROS otherwise  negative.  ____________________________________________   PHYSICAL EXAM:  VITAL SIGNS: ED Triage Vitals  Enc Vitals Group     BP 03/10/15 0523 158/64 mmHg     Pulse Rate 03/10/15 0523 50     Resp 03/10/15 0523 18     Temp 03/10/15 0523 97.8 F (36.6 C)     Temp Source 03/10/15 0523 Oral     SpO2 03/10/15 0523 99 %     Weight 03/10/15 0523 197 lb (89.359 kg)     Height 03/10/15 0523 5\' 11"  (1.803 m)     Head Cir --      Peak Flow --      Pain Score --      Pain Loc --      Pain Edu? --      Excl. in GC? --      Constitutional: Alert and oriented. Well appearing and in no distress. Eyes: Conjunctivae are normal. PERRL. Normal extraocular movements. ENT   Head: Normocephalic contusion noted right parietal occipital area   Nose: No congestion/rhinnorhea.   Mouth/Throat: Mucous membranes are moist.   Neck: No stridor. Hematological/Lymphatic/Immunilogical: No cervical lymphadenopathy. Cardiovascular: Normal rate, regular rhythm. Normal and symmetric distal pulses are present in all extremities. No murmurs, rubs, or gallops. Respiratory: Normal respiratory effort without tachypnea nor retractions. Breath sounds are clear and equal bilaterally. No wheezes/rales/rhonchi. Gastrointestinal: Soft and nontender. No distention. There is no CVA tenderness. Genitourinary: deferred Musculoskeletal: Nontender with normal range of motion in all extremities. No joint effusions.  No lower extremity  tenderness nor edema. L2-5 pain with palpation. Pain to palpation of the right hip. Neurologic:  Normal speech and language. No gross focal neurologic deficits are appreciated. Speech is normal.  Skin:  Skin is warm, dry and intact. No rash noted. Psychiatric: Mood and affect are normal. Speech and behavior are normal. Patient exhibits appropriate insight and judgment.    RADIOLOGY  DG Pelvis 1-2 Views (Final result) Result time: 03/10/15 06:32:15   Final result by Rad Results In  Interface (03/10/15 06:32:15)   Narrative:   CLINICAL DATA: Status post fall down several wooden steps, with lower back pain. Concern for pelvic injury. Initial encounter.  EXAM: PELVIS - 1-2 VIEW  COMPARISON: None.  FINDINGS: There is no evidence of fracture or dislocation. Both femoral heads are seated normally within their respective acetabula. No significant degenerative change is appreciated. The sacroiliac joints are unremarkable in appearance.  The visualized bowel gas pattern is grossly unremarkable in appearance.  IMPRESSION: 1. No evidence of fracture or dislocation. 2. Large amount of stool, compatible with constipation.   Electronically Signed By: Roanna Raider M.D. On: 03/10/2015 06:32          DG Elbow 2 Views Left (Final result) Result time: 03/10/15 13:08:65   Final result by Rad Results In Interface (03/10/15 78:46:96)   Narrative:   CLINICAL DATA: Status post fall down several wooden stairs. Left elbow pain. Initial encounter.  EXAM: LEFT ELBOW - 2 VIEW  COMPARISON: None.  FINDINGS: There is no evidence of fracture or dislocation. The visualized joint spaces are preserved. No significant joint effusion is identified. The soft tissues are unremarkable in appearance.  IMPRESSION: No evidence of fracture or dislocation.   Electronically Signed By: Roanna Raider M.D. On: 03/10/2015 06:29          DG Lumbar Spine Complete (Final result) Result time: 03/10/15 29:52:84   Final result by Rad Results In Interface (03/10/15 13:24:40)   Narrative:   CLINICAL DATA: Status post fall down several wooden stairs, with lower back pain. Initial encounter.  EXAM: LUMBAR SPINE - COMPLETE 4+ VIEW  COMPARISON: CT of the abdomen and pelvis from 01/07/2012  FINDINGS: There is no evidence of fracture or subluxation. Vertebral bodies demonstrate normal height and alignment. Intervertebral disc spaces are preserved. The  visualized neural foramina are grossly unremarkable in appearance.  The visualized bowel gas pattern is unremarkable in appearance; a large amount of stool is noted in the colon. The sacroiliac joints are within normal limits.  IMPRESSION: No evidence of fracture or subluxation along the lumbar spine.   Electronically Signed By: Roanna Raider M.D. On: 03/10/2015 06:28          CT Head Wo Contrast (Final result) Result time: 03/10/15 06:31:16   Final result by Rad Results In Interface (03/10/15 06:31:16)   Narrative:   CLINICAL DATA: Status post fall down several stairs, hitting head. Abrasion on the left side of the face and swelling about the left ear. Initial encounter.  EXAM: CT HEAD WITHOUT CONTRAST  TECHNIQUE: Contiguous axial images were obtained from the base of the skull through the vertex without intravenous contrast.  COMPARISON: None.  FINDINGS: There is no evidence of acute infarction, mass lesion, or intra- or extra-axial hemorrhage on CT.  Prominence of the ventricles and sulci suggests mild cortical volume loss.  The brainstem and fourth ventricle are within normal limits. The basal ganglia are unremarkable in appearance. The cerebral hemispheres demonstrate grossly normal gray-white differentiation. No mass effect or midline shift is  seen.  There is no evidence of fracture; visualized osseous structures are unremarkable in appearance. Postoperative change is noted at the left orbit. The right orbit is unremarkable in appearance. The paranasal sinuses and mastoid air cells are well-aerated. No significant soft tissue abnormalities are seen.  IMPRESSION: 1. No evidence of traumatic intracranial injury or fracture. 2. Mild cortical volume loss noted.   Electronically Signed By: Roanna Raider M.D. On: 03/10/2015 06:31         INITIAL IMPRESSION / ASSESSMENT AND PLAN / ED COURSE  Pertinent labs & imaging results that  were available during my care of the patient were reviewed by me and considered in my medical decision making (see chart for details). Patient received Percocet in the emergency department for analgesia with pain improvement. ____________________________________________   FINAL CLINICAL IMPRESSION(S) / ED DIAGNOSES  Final diagnoses:  Scalp contusion, initial encounter  Abrasion      Darci Current, MD 03/14/15 [phone]

## 2015-03-10 NOTE — ED Notes (Signed)
Pt presents to ED after he fell down several wooden stairs to a cement pad while trying to catch his cat. Pt reports hitting his head, left elbow, and is now having lower back pain. Denies loc. Abrasion to left side of his face and back and left ear is swollen.

## 2015-12-20 ENCOUNTER — Inpatient Hospital Stay
Admission: EM | Admit: 2015-12-20 | Discharge: 2015-12-22 | DRG: 603 | Disposition: A | Discharge status: Home / Self Care | Payer: Medicare Other | Attending: Internal Medicine | Admitting: Internal Medicine

## 2015-12-20 ENCOUNTER — Encounter: Payer: Self-pay | Admitting: Emergency Medicine

## 2015-12-20 DIAGNOSIS — K219 Gastro-esophageal reflux disease without esophagitis: Secondary | ICD-10-CM | POA: Diagnosis present

## 2015-12-20 DIAGNOSIS — M65842 Other synovitis and tenosynovitis, left hand: Secondary | ICD-10-CM | POA: Diagnosis present

## 2015-12-20 DIAGNOSIS — E11610 Type 2 diabetes mellitus with diabetic neuropathic arthropathy: Secondary | ICD-10-CM | POA: Diagnosis present

## 2015-12-20 DIAGNOSIS — Z7984 Long term (current) use of oral hypoglycemic drugs: Secondary | ICD-10-CM

## 2015-12-20 DIAGNOSIS — I110 Hypertensive heart disease with heart failure: Secondary | ICD-10-CM | POA: Diagnosis present

## 2015-12-20 DIAGNOSIS — Z955 Presence of coronary angioplasty implant and graft: Secondary | ICD-10-CM | POA: Diagnosis not present

## 2015-12-20 DIAGNOSIS — M109 Gout, unspecified: Secondary | ICD-10-CM | POA: Diagnosis present

## 2015-12-20 DIAGNOSIS — L03114 Cellulitis of left upper limb: Secondary | ICD-10-CM

## 2015-12-20 DIAGNOSIS — X58XXXA Exposure to other specified factors, initial encounter: Secondary | ICD-10-CM | POA: Diagnosis present

## 2015-12-20 DIAGNOSIS — E1140 Type 2 diabetes mellitus with diabetic neuropathy, unspecified: Secondary | ICD-10-CM | POA: Diagnosis present

## 2015-12-20 DIAGNOSIS — I2510 Atherosclerotic heart disease of native coronary artery without angina pectoris: Secondary | ICD-10-CM | POA: Diagnosis present

## 2015-12-20 DIAGNOSIS — Z794 Long term (current) use of insulin: Secondary | ICD-10-CM | POA: Diagnosis not present

## 2015-12-20 DIAGNOSIS — E781 Pure hyperglyceridemia: Secondary | ICD-10-CM | POA: Diagnosis present

## 2015-12-20 DIAGNOSIS — Z880 Allergy status to penicillin: Secondary | ICD-10-CM

## 2015-12-20 DIAGNOSIS — Z8673 Personal history of transient ischemic attack (TIA), and cerebral infarction without residual deficits: Secondary | ICD-10-CM

## 2015-12-20 DIAGNOSIS — Z87891 Personal history of nicotine dependence: Secondary | ICD-10-CM

## 2015-12-20 DIAGNOSIS — Z79899 Other long term (current) drug therapy: Secondary | ICD-10-CM | POA: Diagnosis not present

## 2015-12-20 DIAGNOSIS — I5032 Chronic diastolic (congestive) heart failure: Secondary | ICD-10-CM | POA: Diagnosis present

## 2015-12-20 DIAGNOSIS — L0390 Cellulitis, unspecified: Secondary | ICD-10-CM | POA: Diagnosis present

## 2015-12-20 DIAGNOSIS — S61213A Laceration without foreign body of left middle finger without damage to nail, initial encounter: Secondary | ICD-10-CM | POA: Diagnosis present

## 2015-12-20 DIAGNOSIS — D696 Thrombocytopenia, unspecified: Secondary | ICD-10-CM | POA: Diagnosis present

## 2015-12-20 DIAGNOSIS — E785 Hyperlipidemia, unspecified: Secondary | ICD-10-CM | POA: Diagnosis present

## 2015-12-20 LAB — CBC WITH DIFFERENTIAL/PLATELET
BASOS ABS: 0 10*3/uL (ref 0–0.1)
Basophils Relative: 0 %
Eosinophils Absolute: 0 10*3/uL (ref 0–0.7)
Eosinophils Relative: 0 %
HEMATOCRIT: 50.6 % (ref 40.0–52.0)
Hemoglobin: 17.6 g/dL (ref 13.0–18.0)
LYMPHS PCT: 13 %
Lymphs Abs: 1.1 10*3/uL (ref 1.0–3.6)
MCH: 31.1 pg (ref 26.0–34.0)
MCHC: 34.7 g/dL (ref 32.0–36.0)
MCV: 89.5 fL (ref 80.0–100.0)
MONO ABS: 0.9 10*3/uL (ref 0.2–1.0)
Monocytes Relative: 11 %
NEUTROS ABS: 6.1 10*3/uL (ref 1.4–6.5)
Neutrophils Relative %: 76 %
Platelets: 131 10*3/uL — ABNORMAL LOW (ref 150–440)
RBC: 5.65 MIL/uL (ref 4.40–5.90)
RDW: 13.7 % (ref 11.5–14.5)
WBC: 8.2 10*3/uL (ref 3.8–10.6)

## 2015-12-20 LAB — COMPREHENSIVE METABOLIC PANEL
ALT: 15 U/L — ABNORMAL LOW (ref 17–63)
AST: 20 U/L (ref 15–41)
Albumin: 3.7 g/dL (ref 3.5–5.0)
Alkaline Phosphatase: 88 U/L (ref 38–126)
Anion gap: 7 (ref 5–15)
BUN: 18 mg/dL (ref 6–20)
CO2: 31 mmol/L (ref 22–32)
Calcium: 9 mg/dL (ref 8.9–10.3)
Chloride: 94 mmol/L — ABNORMAL LOW (ref 101–111)
Creatinine, Ser: 0.99 mg/dL (ref 0.61–1.24)
GFR calc Af Amer: >60 mL/min (ref >60)
GFR calc non Af Amer: >60 mL/min (ref >60)
Glucose, Bld: 256 mg/dL — ABNORMAL HIGH (ref 65–99)
Potassium: 4.2 mmol/L (ref 3.5–5.1)
Sodium: 132 mmol/L — ABNORMAL LOW (ref 135–145)
Total Bilirubin: 1.2 mg/dL (ref 0.3–1.2)
Total Protein: 7.6 g/dL (ref 6.5–8.1)

## 2015-12-20 LAB — GLUCOSE, CAPILLARY: GLUCOSE-CAPILLARY: 182 mg/dL — AB (ref 65–99)

## 2015-12-20 MED ORDER — PANTOPRAZOLE SODIUM 40 MG PO TBEC
40.0000 mg | DELAYED_RELEASE_TABLET | Freq: Every day | ORAL | Status: DC
  Administered 2015-12-20 – 2015-12-22 (×3): 40 mg via ORAL
  Filled 2015-12-20 (×3): qty 1

## 2015-12-20 MED ORDER — INSULIN ASPART 100 UNIT/ML ~~LOC~~ SOLN
0.0000 [IU] | Freq: Every day | SUBCUTANEOUS | Status: DC
  Administered 2015-12-21: 5 [IU] via SUBCUTANEOUS
  Filled 2015-12-20: qty 4

## 2015-12-20 MED ORDER — ONDANSETRON HCL 4 MG/2ML IJ SOLN
INTRAMUSCULAR | Status: AC
  Administered 2015-12-20: 4 mg via INTRAVENOUS
  Filled 2015-12-20: qty 2

## 2015-12-20 MED ORDER — DIPHENHYDRAMINE HCL 50 MG/ML IJ SOLN
INTRAMUSCULAR | Status: AC
  Filled 2015-12-20: qty 1

## 2015-12-20 MED ORDER — ENOXAPARIN SODIUM 40 MG/0.4ML ~~LOC~~ SOLN
40.0000 mg | SUBCUTANEOUS | Status: DC
  Administered 2015-12-21 – 2015-12-22 (×2): 40 mg via SUBCUTANEOUS
  Filled 2015-12-20 (×2): qty 0.4

## 2015-12-20 MED ORDER — ZOLPIDEM TARTRATE 5 MG PO TABS
10.0000 mg | ORAL_TABLET | Freq: Every day | ORAL | Status: DC
  Administered 2015-12-20 – 2015-12-21 (×2): 10 mg via ORAL
  Filled 2015-12-20 (×2): qty 2

## 2015-12-20 MED ORDER — AMITRIPTYLINE HCL 50 MG PO TABS
150.0000 mg | ORAL_TABLET | Freq: Every day | ORAL | Status: DC
  Administered 2015-12-20 – 2015-12-22 (×3): 150 mg via ORAL
  Filled 2015-12-20 (×4): qty 3

## 2015-12-20 MED ORDER — INSULIN ASPART 100 UNIT/ML ~~LOC~~ SOLN
0.0000 [IU] | Freq: Three times a day (TID) | SUBCUTANEOUS | Status: DC
  Administered 2015-12-21: 1 [IU] via SUBCUTANEOUS
  Administered 2015-12-21: 2 [IU] via SUBCUTANEOUS
  Administered 2015-12-21: 3 [IU] via SUBCUTANEOUS
  Administered 2015-12-22: 1 [IU] via SUBCUTANEOUS
  Filled 2015-12-20: qty 1
  Filled 2015-12-20: qty 3
  Filled 2015-12-20 (×2): qty 1

## 2015-12-20 MED ORDER — POTASSIUM CHLORIDE CRYS ER 20 MEQ PO TBCR
20.0000 meq | EXTENDED_RELEASE_TABLET | Freq: Every day | ORAL | Status: DC
  Administered 2015-12-20 – 2015-12-22 (×3): 20 meq via ORAL
  Filled 2015-12-20 (×3): qty 1

## 2015-12-20 MED ORDER — CLOPIDOGREL BISULFATE 75 MG PO TABS
75.0000 mg | ORAL_TABLET | Freq: Every day | ORAL | Status: DC
  Administered 2015-12-21 – 2015-12-22 (×2): 75 mg via ORAL
  Filled 2015-12-20 (×2): qty 1

## 2015-12-20 MED ORDER — MORPHINE SULFATE (PF) 2 MG/ML IV SOLN: 2.0000 mg | INTRAVENOUS | Status: DC | PRN

## 2015-12-20 MED ORDER — FUROSEMIDE 40 MG PO TABS
40.0000 mg | ORAL_TABLET | Freq: Two times a day (BID) | ORAL | Status: DC
  Administered 2015-12-20 – 2015-12-22 (×4): 40 mg via ORAL
  Filled 2015-12-20 (×4): qty 1

## 2015-12-20 MED ORDER — CYCLOBENZAPRINE HCL 10 MG PO TABS
10.0000 mg | ORAL_TABLET | Freq: Two times a day (BID) | ORAL | Status: DC | PRN
  Administered 2015-12-21: 10 mg via ORAL
  Filled 2015-12-20: qty 1

## 2015-12-20 MED ORDER — ONDANSETRON HCL 4 MG/2ML IJ SOLN: 4.0000 mg | Freq: Four times a day (QID) | INTRAMUSCULAR | Status: DC | PRN

## 2015-12-20 MED ORDER — OXYCODONE-ACETAMINOPHEN 7.5-325 MG PO TABS
1.0000 | ORAL_TABLET | ORAL | Status: DC | PRN
Start: 2015-12-20 — End: 2015-12-22
  Administered 2015-12-21 – 2015-12-22 (×5): 1 via ORAL
  Filled 2015-12-20 (×5): qty 1

## 2015-12-20 MED ORDER — MORPHINE SULFATE (PF) 4 MG/ML IV SOLN
INTRAVENOUS | Status: AC
  Filled 2015-12-20: qty 1

## 2015-12-20 MED ORDER — INSULIN GLARGINE 100 UNIT/ML ~~LOC~~ SOLN
50.0000 [IU] | Freq: Every day | SUBCUTANEOUS | Status: DC
  Administered 2015-12-20 – 2015-12-21 (×2): 50 [IU] via SUBCUTANEOUS
  Filled 2015-12-20 (×3): qty 0.5

## 2015-12-20 MED ORDER — CARVEDILOL 3.125 MG PO TABS
3.1250 mg | ORAL_TABLET | Freq: Two times a day (BID) | ORAL | Status: DC
  Administered 2015-12-21 – 2015-12-22 (×3): 3.125 mg via ORAL
  Filled 2015-12-20 (×4): qty 1

## 2015-12-20 MED ORDER — MORPHINE SULFATE (PF) 4 MG/ML IV SOLN
4.0000 mg | Freq: Once | INTRAVENOUS | Status: AC
  Administered 2015-12-20: 4 mg via INTRAVENOUS

## 2015-12-20 MED ORDER — PREGABALIN 50 MG PO CAPS
100.0000 mg | ORAL_CAPSULE | Freq: Three times a day (TID) | ORAL | Status: DC
  Administered 2015-12-20 – 2015-12-22 (×5): 100 mg via ORAL
  Filled 2015-12-20 (×5): qty 2

## 2015-12-20 MED ORDER — ASPIRIN EC 81 MG PO TBEC
81.0000 mg | DELAYED_RELEASE_TABLET | Freq: Every day | ORAL | Status: DC
  Administered 2015-12-21 – 2015-12-22 (×2): 81 mg via ORAL
  Filled 2015-12-20 (×2): qty 1

## 2015-12-20 MED ORDER — GLIPIZIDE ER 10 MG PO TB24
20.0000 mg | ORAL_TABLET | Freq: Every day | ORAL | Status: DC
  Administered 2015-12-21 – 2015-12-22 (×2): 20 mg via ORAL
  Filled 2015-12-20 (×2): qty 2

## 2015-12-20 MED ORDER — ATORVASTATIN CALCIUM 20 MG PO TABS
80.0000 mg | ORAL_TABLET | Freq: Every day | ORAL | Status: DC
  Administered 2015-12-20 – 2015-12-22 (×3): 80 mg via ORAL
  Filled 2015-12-20 (×3): qty 4

## 2015-12-20 MED ORDER — LISINOPRIL 10 MG PO TABS
5.0000 mg | ORAL_TABLET | Freq: Every day | ORAL | Status: DC
  Administered 2015-12-20 – 2015-12-22 (×3): 5 mg via ORAL
  Filled 2015-12-20: qty 2
  Filled 2015-12-20: qty 1
  Filled 2015-12-20: qty 2
  Filled 2015-12-20: qty 1

## 2015-12-20 MED ORDER — VANCOMYCIN HCL IN DEXTROSE 1-5 GM/200ML-% IV SOLN
1000.0000 mg | Freq: Once | INTRAVENOUS | Status: AC
  Administered 2015-12-20: 1000 mg via INTRAVENOUS

## 2015-12-20 MED ORDER — ACETAMINOPHEN 650 MG RE SUPP
650.0000 mg | Freq: Four times a day (QID) | RECTAL | Status: DC | PRN
  Filled 2015-12-20: qty 1

## 2015-12-20 MED ORDER — GEMFIBROZIL 600 MG PO TABS
600.0000 mg | ORAL_TABLET | Freq: Two times a day (BID) | ORAL | Status: DC
  Administered 2015-12-20 – 2015-12-22 (×4): 600 mg via ORAL
  Filled 2015-12-20 (×4): qty 1

## 2015-12-20 MED ORDER — PIPERACILLIN-TAZOBACTAM 3.375 G IVPB
3.3750 g | Freq: Once | INTRAVENOUS | Status: AC
  Administered 2015-12-20: 3.375 g via INTRAVENOUS

## 2015-12-20 MED ORDER — ONDANSETRON HCL 4 MG PO TABS
4.0000 mg | ORAL_TABLET | Freq: Four times a day (QID) | ORAL | Status: DC | PRN
  Filled 2015-12-20: qty 1

## 2015-12-20 MED ORDER — VANCOMYCIN HCL IN DEXTROSE 1-5 GM/200ML-% IV SOLN
INTRAVENOUS | Status: AC
  Administered 2015-12-20: 1000 mg via INTRAVENOUS
  Filled 2015-12-20: qty 200

## 2015-12-20 MED ORDER — COLCHICINE 0.6 MG PO TABS
0.6000 mg | ORAL_TABLET | Freq: Two times a day (BID) | ORAL | Status: DC
  Administered 2015-12-20 – 2015-12-22 (×4): 0.6 mg via ORAL
  Filled 2015-12-20 (×5): qty 1

## 2015-12-20 MED ORDER — PIPERACILLIN-TAZOBACTAM 3.375 G IVPB
INTRAVENOUS | Status: AC
  Filled 2015-12-20: qty 50

## 2015-12-20 MED ORDER — ACETAMINOPHEN 325 MG PO TABS: 650.0000 mg | ORAL_TABLET | Freq: Four times a day (QID) | ORAL | Status: DC | PRN

## 2015-12-20 MED ORDER — ONDANSETRON HCL 4 MG/2ML IJ SOLN
4.0000 mg | Freq: Once | INTRAMUSCULAR | Status: AC
  Administered 2015-12-20: 4 mg via INTRAVENOUS

## 2015-12-20 MED ORDER — DIPHENHYDRAMINE HCL 50 MG/ML IJ SOLN
25.0000 mg | Freq: Once | INTRAMUSCULAR | Status: AC
  Administered 2015-12-20: 25 mg via INTRAVENOUS

## 2015-12-20 NOTE — ED Triage Notes (Addendum)
Pt reports getting cut on left middle finger 2 days ago by ziploc bag, pt now with cellulitis to finger and top of hand. Pt with noted bradycardia, reports 40-60 is WNL for him, reports his cardiologist is at [facility].

## 2015-12-20 NOTE — ED Notes (Signed)
Pt given a warm blanket to put around neck, light turned down, given a remote to TV,  and extra pillow to elevate left hand to make more comfortable.  Wife at the bedside. No distress noted. Will continue to monitor.

## 2015-12-20 NOTE — H&P (Signed)
[facility] [place] [facility]   PATIENT NAME: Mark Armstrong    MR#:  [id number]  DATE OF BIRTH:  Sep 03, 1950  DATE OF ADMISSION:  12/20/2015  PRIMARY CARE PHYSICIAN: [facility]   REQUESTING/REFERRING PHYSICIAN: DR Presley Raddle  CHIEF COMPLAINT:   Chief Complaint  Patient presents with  . Cellulitis    HISTORY OF PRESENT ILLNESS:  Mark Armstrong  is a 65 y.o. male with a known history of Diabetes, coronary artery disease, Charcot foot. He presents to the ER today after he cut his finger with a zip lock bag 2 days ago. He was at his pain management doctor yesterday and they recommended him getting to his primary care physician. Yesterday afternoon it started swelling up and turning red. Today the redness started going up his hand and wrist and into the arm. He had a low-grade temperature. Patient has 8 out of 10 pain in his finger. Hospitalist services were contacted for rapidly spreading cellulitis.  PAST MEDICAL HISTORY:   Past Medical History:  Diagnosis Date  . 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 CHF (congestive heart failure) (HCC)   . Diabetes mellitus without complication (HCC)   . Glaucoma   . Hypertension   . Stroke [facility])     PAST SURGICAL HISTORY:   Past Surgical History:  Procedure Laterality Date  . CARDIAC SURGERY    . CORONARY ARTERY BYPASS GRAFT    . EYE SURGERY    . Right foot surgery    . Right knee arthroscopy    . TOE AMPUTATION      SOCIAL HISTORY:   Social History  Substance Use Topics  . Smoking status: Former Games developer  . Smokeless tobacco: Never Used  . Alcohol use No    FAMILY HISTORY:   Family History  Problem Relation Age of Onset  . CAD Mother   . Diabetes Mother   . CAD Father   . Diabetes Father     DRUG ALLERGIES:   Allergies  Allergen Reactions  . Penicillins Rash    REVIEW OF SYSTEMS:  CONSTITUTIONAL: Positive for fever. No fatigue or weakness.  EYES: No blurred or  double vision. Wears glasses. EARS, NOSE, AND THROAT: No tinnitus or ear pain. No sore throat RESPIRATORY: No cough, shortness of breath, wheezing or hemoptysis.  CARDIOVASCULAR: No chest pain, orthopnea, edema.  GASTROINTESTINAL: No nausea, vomiting, diarrhea or abdominal pain. No blood in bowel movements GENITOURINARY: No dysuria, hematuria.  ENDOCRINE: No polyuria, nocturia,  HEMATOLOGY: No anemia, easy bruising or bleeding SKIN: No rash or lesion. MUSCULOSKELETAL: 8 out of 10 pain in his finger. History of hip pain. Pain in his right foot poking-type pain.  NEUROLOGIC: Neuropathy right foot. PSYCHIATRY: No anxiety or depression.   MEDICATIONS AT HOME:   Prior to Admission medications   Medication Sig Start Date End Date Taking? Authorizing Provider  amitriptyline (ELAVIL) 150 MG tablet Take 150 mg by mouth daily.   Yes Historical Provider, MD  aspirin 81 MG tablet Take 81 mg by mouth daily. 05/03/04  Yes Historical Provider, MD  atorvastatin (LIPITOR) 80 MG tablet Take 80 mg by mouth daily. 12/11/15  Yes Historical Provider, MD  carvedilol (COREG) 3.125 MG tablet Take 3.125 mg by mouth 2 (two) times daily. 12/11/15  Yes Historical Provider, MD  clopidogrel (PLAVIX) 75 MG tablet Take 75 mg by mouth daily. 08/08/15 08/07/16 Yes Historical Provider, MD  colchicine 0.6 MG tablet Take 0.6 mg by mouth 2 (two) times  daily. 11/08/15  Yes Historical Provider, MD  cyclobenzaprine (FLEXERIL) 10 MG tablet Take 10 mg by mouth 2 (two) times daily as needed. 11/26/15  Yes Historical Provider, MD  esomeprazole (NEXIUM) 40 MG capsule Take 40 mg by mouth daily. 11/08/13  Yes Historical Provider, MD  furosemide (LASIX) 40 MG tablet Take 40 mg by mouth 2 (two) times daily. 09/23/15  Yes Historical Provider, MD  gemfibrozil (LOPID) 600 MG tablet Take 600 mg by mouth 2 (two) times daily. 11/03/13  Yes Historical Provider, MD  glipiZIDE (GLUCOTROL XL) 10 MG 24 hr tablet Take 20 mg by mouth daily. 11/08/13  Yes  Historical Provider, MD  insulin glargine (LANTUS) 100 UNIT/ML injection Inject 50 Units into the skin at bedtime. 01/12/14  Yes Historical Provider, MD  lisinopril (PRINIVIL,ZESTRIL) 5 MG tablet Take 5 mg by mouth daily.   Yes Historical Provider, MD  metFORMIN (GLUCOPHAGE) 500 MG tablet Take 500 mg by mouth 2 (two) times daily with a meal.   Yes Historical Provider, MD  potassium chloride SA (K-DUR,KLOR-CON) 20 MEQ tablet Take 20 mEq by mouth daily.   Yes Historical Provider, MD  pregabalin (LYRICA) 100 MG capsule Take 100 mg by mouth 3 (three) times daily. 05/02/13  Yes Historical Provider, MD      VITAL SIGNS:  Blood pressure 131/83, pulse (!) 103, temperature 98 F (36.7 C), resp. rate 20, height 6\' 1"  (1.854 m), weight 92.5 kg (204 lb), SpO2 100 %.  PHYSICAL EXAMINATION:  GENERAL:  65 y.o.-year-old patient lying in the bed with no acute distress.  EYES: Pupils equal, round, reactive to light and accommodation. No scleral icterus. Extraocular muscles intact.  HEENT: Head atraumatic, normocephalic. Oropharynx and nasopharynx clear.  NECK:  Supple, no jugular venous distention. No thyroid enlargement, no tenderness.  LUNGS: Normal breath sounds bilaterally, no wheezing, rales,rhonchi or crepitation. No use of accessory muscles of respiration.  CARDIOVASCULAR: S1, S2 normal. No murmurs, rubs, or gallops.  ABDOMEN: Soft, nontender, nondistended. Bowel sounds present. No organomegaly or mass.  EXTREMITIES: 2+ edema. No cyanosis, or clubbing.  NEUROLOGIC: Cranial nerves II through XII are intact. Muscle strength 5/5 in all extremities. Sensation intact. Gait not checked.  PSYCHIATRIC: The patient is alert and oriented x 3.  SKIN: Left third finger with laceration on the top with slight pus coming out. Erythema and swelling surrounding. Erythema down the finger and up the left hand into the left forearm. Patient is able to bend the DIP and PIP joint of the third finger.  LABORATORY PANEL:    CBC  Recent Labs Lab 12/20/15 1845  WBC 8.2  HGB 17.6  HCT 50.6  PLT 131*   ------------------------------------------------------------------------------------------------------------------  Chemistries   Recent Labs Lab 12/20/15 1845  NA 132*  K 4.2  CL 94*  CO2 31  GLUCOSE 256*  BUN 18  CREATININE 0.99  CALCIUM 9.0  AST 20  ALT 15*  ALKPHOS 88  BILITOT 1.2   ------------------------------------------------------------------------------------------------------------------   IMPRESSION AND PLAN:   1. Cellulitis left third finger tracking into the hand and up the forearm. ER physician started vancomycin and Zosyn and I will continue this. ER physician spoke with Dr. Ernest Pine orthopedic surgery to evaluate. When necessary pain medication. 2. Type 2 diabetes mellitus continue Lantus insulin and glipizide. Hold off on Glucophage for now. 3. History of coronary artery disease with recent stent less than 6 months ago. Continue aspirin, Plavix, Coreg and statin. 4. History of gout on colchicine 5. Hyperlipidemia and hypertriglyceridemia on statin and  Lopid 6. Essential hypertension on lisinopril and Coreg 7. GERD on PPI 8. Thrombocytopenia. Looking back at old labs this looks chronic area check a hepatitis C.  All the records are reviewed and case discussed with ED provider. Management plans discussed with the patient, family and they are in agreement.  CODE STATUS: Full code  TOTAL TIME TAKING CARE OF THIS PATIENT: 50 minutes.    Alford Highland M.D on 12/20/2015 at 9:16 PM  Between 7am to 6pm - Pager - [phone]  After 6pm call admission pager [phone]  [facility] Office  [phone]  CC: Primary care physician; [facility]

## 2015-12-20 NOTE — ED Notes (Signed)
Pt requesting something to eat and drink hasnt ate all day. Will ask Dr. Mayford Knife. Pt states pain is climbing.

## 2015-12-20 NOTE — ED Notes (Signed)
Dr Mayford Knife made aware Zosyn finished to early. Dr. Mayford Knife said it was no problems.

## 2015-12-20 NOTE — ED Provider Notes (Signed)
[facility] Emergency Department Provider Note        Time seen: ----------------------------------------- 6:36 PM on 12/20/2015 -----------------------------------------    I have reviewed the triage vital signs and the nursing notes.   HISTORY  Chief Complaint Cellulitis    HPI Mark Armstrong is a 65 y.o. male who presents to the ER for evaluation of his left hand. Patient reports getting caught on the dorsum of his left middle finger 2 days ago bicep Loc bag. Now he complains of cellulitis to his finger, redness swelling and pain. Family reports he had purulent drainage from the wound today. He denies fevers or chills, has pain with range of motion of the left middle finger. Patient denies a history of wound infections in the past   Past Medical History:  Diagnosis Date  . 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 CHF (congestive heart failure) (HCC)   . Diabetes mellitus without complication (HCC)   . Glaucoma   . Hypertension   . Stroke [facility])     There are no active problems to display for this patient.   Past Surgical History:  Procedure Laterality Date  . CARDIAC SURGERY    . EYE SURGERY      Allergies Penicillins  Social History Social History  Substance Use Topics  . Smoking status: Former Games developer  . Smokeless tobacco: Never Used  . Alcohol use No    Review of Systems Constitutional: Negative for fever. Cardiovascular: Negative for chest pain. Respiratory: Negative for shortness of breath. Gastrointestinal: Negative for abdominal pain, vomiting and diarrhea. Genitourinary: Negative for dysuria. Musculoskeletal: Negative for back pain. Skin: Positive for erythema and drainage to the left middle finger Neurological: Negative for headaches, focal weakness or numbness.  10-point ROS otherwise negative.  ____________________________________________   PHYSICAL EXAM:  VITAL SIGNS: ED Triage Vitals  Enc Vitals Group      BP 12/20/15 1820 135/65     Pulse Rate 12/20/15 1820 (!) 42     Resp 12/20/15 1820 17     Temp 12/20/15 1820 99 F (37.2 C)     Temp Source 12/20/15 1820 Oral     SpO2 12/20/15 1820 96 %     Weight 12/20/15 1820 204 lb (92.5 kg)     Height 12/20/15 1820 6\' 1"  (1.854 m)     Head Circumference --      Peak Flow --      Pain Score 12/20/15 1822 8     Pain Loc --      Pain Edu? --      Excl. in GC? --     Constitutional: Alert and oriented. Well appearing and in no distress. Eyes: Conjunctivae are normal. PERRL. Normal extraocular movements. Cardiovascular: Normal rate, regular rhythm. No murmurs, rubs, or gallops. Respiratory: Normal respiratory effort without tachypnea nor retractions. Breath sounds are clear and equal bilaterally. No wheezes/rales/rhonchi. Gastrointestinal: Soft and nontender. Normal bowel sounds Musculoskeletal: Pain with range of motion of the third digit on the left hand. There is extensive dorsal erythema and fusiform swelling of the left third digit with an open wound over the proximal interphalangeal joint dorsally. There is some yellowish drainage at this time. Erythema extends up the dorsum of his hand to his wrist dorsomedially Neurologic:  Normal speech and language. No gross focal neurologic deficits are appreciated.  Skin:  Erythema, left third digit wound as dictated above Psychiatric: Mood and affect are normal. Speech and behavior are normal.  ____________________________________________  ED  COURSE:  Pertinent labs & imaging results that were available during my care of the patient were reviewed by me and considered in my medical decision making (see chart for details). Clinical Course  Patient presents to ER with concerns for tenosynovitis. Patient certainly has cellulitis of the left hand and now early tenosynovitis. He will receive IV vancomycin and Zosyn. He'll discuss with orthopedics and hospitalist for  admission.  Procedures ____________________________________________   LABS (pertinent positives/negatives)  Labs Reviewed  CBC WITH DIFFERENTIAL/PLATELET - Abnormal; Notable for the following:       Result Value   Platelets 131 (*)    All other components within normal limits  COMPREHENSIVE METABOLIC PANEL - Abnormal; Notable for the following:    Sodium 132 (*)    Chloride 94 (*)    Glucose, Bld 256 (*)    ALT 15 (*)    All other components within normal limits  AEROBIC/ANAEROBIC CULTURE (SURGICAL/DEEP WOUND)  ____________________________________________  FINAL ASSESSMENT AND PLAN  Cellulitis  Plan: Patient with labs as dictated above. Patient was cellulitis and early tenosynovitis in the left third digit. I have ordered vancomycin and Zosyn for him.I have discussed with Dr. Ernest Pine from orthopedic surgery. He is stable for admission at this time.   Emily Filbert, MD   Note: This dictation was prepared with Dragon dictation. Any transcriptional errors that result from this process are unintentional    Emily Filbert, MD 12/20/15 [phone]

## 2015-12-21 LAB — BASIC METABOLIC PANEL
Anion gap: 6 (ref 5–15)
BUN: 18 mg/dL (ref 6–20)
CHLORIDE: 98 mmol/L — AB (ref 101–111)
CO2: 29 mmol/L (ref 22–32)
CREATININE: 0.92 mg/dL (ref 0.61–1.24)
Calcium: 8.5 mg/dL — ABNORMAL LOW (ref 8.9–10.3)
GFR calc Af Amer: >60 mL/min (ref >60)
GFR calc non Af Amer: >60 mL/min (ref >60)
GLUCOSE: 147 mg/dL — AB (ref 65–99)
Potassium: 3.8 mmol/L (ref 3.5–5.1)
SODIUM: 133 mmol/L — AB (ref 135–145)

## 2015-12-21 LAB — GLUCOSE, CAPILLARY
GLUCOSE-CAPILLARY: 303 mg/dL — AB (ref 65–99)
GLUCOSE-CAPILLARY: 328 mg/dL — AB (ref 65–99)
Glucose-Capillary: 132 mg/dL — ABNORMAL HIGH (ref 65–99)
Glucose-Capillary: 232 mg/dL — ABNORMAL HIGH (ref 65–99)
Glucose-Capillary: 186 mg/dL — ABNORMAL HIGH (ref 65–99)

## 2015-12-21 LAB — CBC
HCT: 45.1 % (ref 40.0–52.0)
Hemoglobin: 15.7 g/dL (ref 13.0–18.0)
MCH: 31.1 pg (ref 26.0–34.0)
MCHC: 34.9 g/dL (ref 32.0–36.0)
MCV: 89 fL (ref 80.0–100.0)
Platelets: 131 10*3/uL — ABNORMAL LOW (ref 150–440)
RBC: 5.07 MIL/uL (ref 4.40–5.90)
RDW: 13.6 % (ref 11.5–14.5)
WBC: 8.6 10*3/uL (ref 3.8–10.6)

## 2015-12-21 MED ORDER — VANCOMYCIN HCL 10 G IV SOLR
1250.0000 mg | Freq: Two times a day (BID) | INTRAVENOUS | Status: DC
  Administered 2015-12-21 – 2015-12-22 (×2): 1250 mg via INTRAVENOUS
  Filled 2015-12-21 (×3): qty 1250

## 2015-12-21 NOTE — Progress Notes (Signed)
[employer] - [place] [facility]   PATIENT NAME: Mark Armstrong    MR#:  [id number]  DATE OF BIRTH:  [birth year]  SUBJECTIVE:   Pt. Here due to left finger/hand cellulitis.  Improved since yesterday on IV abx.  No other complaints presently.   REVIEW OF SYSTEMS:    Review of Systems  Constitutional: Negative for chills and fever.  HENT: Negative for congestion and tinnitus.   Eyes: Negative for blurred vision and double vision.  Respiratory: Negative for cough, shortness of breath and wheezing.   Cardiovascular: Negative for chest pain, orthopnea and PND.  Gastrointestinal: Negative for abdominal pain, diarrhea, nausea and vomiting.  Genitourinary: Negative for dysuria and hematuria.  Neurological: Negative for dizziness, sensory change and focal weakness.  All other systems reviewed and are negative.   Nutrition: Heart Healthy Tolerating Diet: Yes Tolerating PT: Await eval.    DRUG ALLERGIES:   Allergies  Allergen Reactions  . Penicillins Rash    VITALS:  Blood pressure (!) 126/105, pulse (!) 57, temperature 97.4 F (36.3 C), temperature source Oral, resp. rate 18, height  (1.854 m), weight 95.1 kg (209 lb 11.2 oz), SpO2 95 %.  PHYSICAL EXAMINATION:   Physical Exam  GENERAL:  65 y.o.-year-old patient lying in the bed with no acute distress.  EYES: Pupils equal, round, reactive to light and accommodation. No scleral icterus. Extraocular muscles intact.  HEENT: Head atraumatic, normocephalic. Oropharynx and nasopharynx clear.  NECK:  Supple, no jugular venous distention. No thyroid enlargement, no tenderness.  LUNGS: Normal breath sounds bilaterally, no wheezing, rales, rhonchi. No use of accessory muscles of respiration.  CARDIOVASCULAR: S1, S2 normal. No murmurs, rubs, or gallops.  ABDOMEN: Soft, nontender, nondistended. Bowel sounds present. No organomegaly or mass.  EXTREMITIES: No cyanosis, clubbing, +1-2 edema b/l.   Left third finger with  small lac but no drainage.  Some distal streaking redness, fluctuance but improved since yesterday.  NEUROLOGIC: Cranial nerves II through XII are intact. No focal Motor or sensory deficits b/l.   PSYCHIATRIC: The patient is alert and oriented x 3.  SKIN: No obvious rash, lesion, or ulcer. Left third finger small laceration but with no acute drainage. Some distal streaking redness, some proximal redness. No fluctuant area, patient is able to bend his DIP and PIP joints.   LABORATORY PANEL:   CBC  Recent Labs Lab 12/21/15 0342  WBC 8.6  HGB 15.7  HCT 45.1  PLT 131*   ------------------------------------------------------------------------------------------------------------------  Chemistries   Recent Labs Lab 12/20/15 1845 12/21/15 0342  NA 132* 133*  K 4.2 3.8  CL 94* 98*  CO2 31 29  GLUCOSE 256* 147*  BUN 18 18  CREATININE 0.99 0.92  CALCIUM 9.0 8.5*  AST 20  --   ALT 15*  --   ALKPHOS 88  --   BILITOT 1.2  --    ------------------------------------------------------------------------------------------------------------------  Cardiac Enzymes No results for input(s): TROPONINI in the last 168 hours. ------------------------------------------------------------------------------------------------------------------  RADIOLOGY:  No results found.   ASSESSMENT AND PLAN:   65 year old male with past medical history of CVA, hypertension, glaucoma, diabetes, history of CHF who presents to the hospital due to left hand cellulitis.  1. Left hand cellulitis-continue IV vancomycin. Clinically improved since yesterday. -Afebrile, hemodynamically stable. Patient has a normal white cell count. -Seen by orthopedic surgery and no plans for surgical intervention as it's clinically improving.  2. History of previous CVA-continue Plavix, statin.  3. History of CHF-chronic diastolic in nature. -Continue carvedilol, Lasix,  lisinopril.  4. Diabetes type 2 without  complication-continue Lantus, sliding scale insulin. Blood sugar stable.  5. Diabetic neuropathy-continue Lyrica  6. GERD-continue Protonix.  7. Hyperlipidemia-continue atorvastatin.   All the records are reviewed and case discussed with Care Management/Social Workerr. Management plans discussed with the patient, family and they are in agreement.  CODE STATUS: Full  DVT Prophylaxis: Lovenox  TOTAL TIME TAKING CARE OF THIS PATIENT: 30 minutes.   POSSIBLE D/C IN 1-2 DAYS, DEPENDING ON CLINICAL CONDITION.   Houston Siren M.D on 12/21/2015 at 2:49 PM  Between 7am to 6pm - Pager - [phone]  After 6pm go to [email or web address] - password [email or web address] [facility]  [facility] [place]  Office  [phone]  CC: Primary care physician; No primary care provider on file.

## 2015-12-21 NOTE — Progress Notes (Signed)
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  Family: wife at bedside  Orders have been reviewed and implemented. Will continue to monitor the patient. Call light has been placed within reach, bed and chair alarms have been activated.   Janice Norrie BSN, RN [facility] 1A

## 2015-12-21 NOTE — Consult Note (Signed)
ORTHOPAEDIC CONSULTATION  PATIENT NAME: Mark Armstrong DOB: August 09, 1950  MRN: [id number]  REQUESTING PHYSICIAN: Houston Siren, MD  Chief Complaint: Left hand pain and redness  HPI: Mark Armstrong is a 65 y.o. right-hand dominant male who complains of  pain, erythema, and drainage to the left hand. Approximately 3 days ago he sustained a cut over the dorsum of the left long PIP joint while transferring cat food into a smaller container. He believes he sustained the cut from the zip lock closure. He initially treated the site with bacitracin ointment. The following day he noticed some erythema to the digit which then increased in severity to include the back of the hand and on the lower forearm. He was evaluated last night in the emergency department and admitted for treatment of cellulitis.  The patient states that the swelling and erythema has subsided significantly after initiation of antibiotics.  Past Medical History:  Diagnosis Date  . 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 CHF (congestive heart failure) (HCC)   . Diabetes mellitus without complication (HCC)   . Glaucoma   . Hypertension   . Stroke [facility])    Past Surgical History:  Procedure Laterality Date  . CARDIAC SURGERY    . CORONARY ARTERY BYPASS GRAFT    . EYE SURGERY    . Right foot surgery    . Right knee arthroscopy    . TOE AMPUTATION     Social History   Social History  . Marital status: Married    Spouse name: N/A  . Number of children: N/A  . Years of education: N/A   Social History Main Topics  . Smoking status: Former Games developer  . Smokeless tobacco: Never Used  . Alcohol use No  . Drug use: No  . Sexual activity: Not Asked   Other Topics Concern  . None   Social History Narrative  . None   Family History  Problem Relation Age of Onset  . CAD Mother   . Diabetes Mother   . CAD Father   . Diabetes Father    Allergies  Allergen Reactions  . Penicillins Rash   Prior to Admission medications    Medication Sig Start Date End Date Taking? Authorizing Provider  amitriptyline (ELAVIL) 150 MG tablet Take 150 mg by mouth daily.   Yes Historical Provider, MD  aspirin 81 MG tablet Take 81 mg by mouth daily. 05/03/04  Yes Historical Provider, MD  atorvastatin (LIPITOR) 80 MG tablet Take 80 mg by mouth daily. 12/11/15  Yes Historical Provider, MD  carvedilol (COREG) 3.125 MG tablet Take 3.125 mg by mouth 2 (two) times daily. 12/11/15  Yes Historical Provider, MD  clopidogrel (PLAVIX) 75 MG tablet Take 75 mg by mouth daily. 08/08/15 08/07/16 Yes Historical Provider, MD  colchicine 0.6 MG tablet Take 0.6 mg by mouth 2 (two) times daily. 11/08/15  Yes Historical Provider, MD  cyclobenzaprine (FLEXERIL) 10 MG tablet Take 10 mg by mouth 2 (two) times daily as needed. 11/26/15  Yes Historical Provider, MD  esomeprazole (NEXIUM) 40 MG capsule Take 40 mg by mouth daily. 11/08/13  Yes Historical Provider, MD  furosemide (LASIX) 40 MG tablet Take 40 mg by mouth 2 (two) times daily. 09/23/15  Yes Historical Provider, MD  gemfibrozil (LOPID) 600 MG tablet Take 600 mg by mouth 2 (two) times daily. 11/03/13  Yes Historical Provider, MD  glipiZIDE (GLUCOTROL XL) 10 MG 24 hr tablet Take 20 mg by mouth daily. 11/08/13  Yes Historical  Provider, MD  insulin glargine (LANTUS) 100 UNIT/ML injection Inject 50 Units into the skin at bedtime. 01/12/14  Yes Historical Provider, MD  lisinopril (PRINIVIL,ZESTRIL) 5 MG tablet Take 5 mg by mouth daily.   Yes Historical Provider, MD  metFORMIN (GLUCOPHAGE) 500 MG tablet Take 500 mg by mouth 2 (two) times daily with a meal.   Yes Historical Provider, MD  potassium chloride SA (K-DUR,KLOR-CON) 20 MEQ tablet Take 20 mEq by mouth daily.   Yes Historical Provider, MD  pregabalin (LYRICA) 100 MG capsule Take 100 mg by mouth 3 (three) times daily. 05/02/13  Yes Historical Provider, MD   No results found.  Positive ROS: All other systems have been reviewed and were otherwise negative with the  exception of those mentioned in the HPI and as above.  Physical Exam: General: Alert and alert in no acute distress. Skin: There is a superficial skin tear over the dorsum of the left long PIP joint. Erythema extends from the PIP joint proximally to the midportion of the dorsum of the hand.  Neurologic: Awake, alert, and oriented. Sensory function is grossly intact. Motor strength is felt to be 5 over 5 bilaterally. No clonus or tremor. Good motor coordination. Lymphatic: No axillary or cervical lymphadenopathy  MUSCULOSKELETAL: Examination of the left hand demonstrates no palpable fluctuance. Patient demonstrates reasonably good range of motion of the digits. Kanavel signs are negative. Mild swelling is noted to the proximal phalanx. Minimal swelling is noted to the hand.  Assessment: Cellulitis of the left hand, improving  Plan: The patient was placed on Zosyn and he demonstrates significant improvement to the hand. Pain is well-controlled this point. He has remained afebrile and there is no significant elevation in his white count. I do not appreciate an indication for surgical intervention at this time. Continue with antibiotic coverage.  Naziah Weckerly P. Angie Fava M.D.

## 2015-12-21 NOTE — Care Management Note (Signed)
Case Management Note  Patient Details  Name: Mark Armstrong MRN: [id number] Date of Birth: [birth year]  Subjective/Objective:   Spoke with patient who is alert and oriented from home with spouse. Patient has a walker and W/C and is followed by cardiologist for recent MI. Was a patient of Dr Hall Busing. But has been assigned a new PCP whom he has not been seen by yet. Patient is independent and spouse is able to assist him.   No issuses obtaining Rx.  No CM needs identified.           Action/Plan: Anticipated discharge plan is home with self care.   Expected Discharge Date:                  Expected Discharge Plan:  Home/Self Care  In-House Referral:     Discharge planning Services  CM Consult  Post Acute Care Choice:    Choice offered to:     DME Arranged:    DME Agency:     HH Arranged:    HH Agency:     Status of Service:  Completed, signed off  If discussed at [employer] of Stay Meetings, dates discussed:    Additional Comments:  Adonis Huguenin, RN 12/21/2015, 10:21 AM

## 2015-12-21 NOTE — Progress Notes (Signed)
Pharmacy Antibiotic Note  Mark Armstrong is a 65 y.o. male admitted on 12/20/2015 with cellulitis.  Pharmacy has been consulted for Vancomycin dosing.  Plan: Vancomycin 1250mg  IV every 12 hours.  Goal trough 10-15 mcg/mL.   Will check a trough level prior to  5th dose.  Height: 6\' 1"  (185.4 cm) Weight: 209 lb 11.2 oz (95.1 kg) IBW/kg (Calculated) : 79.9  Temp (24hrs), Avg:98.3 F (36.8 C), Min:97.4 F (36.3 C), Max:99.3 F (37.4 C)   Recent Labs Lab 12/20/15 1845 12/21/15 0342  WBC 8.2 8.6  CREATININE 0.99 0.92    Estimated Creatinine Clearance: 91.7 mL/min (by C-G formula based on SCr of 0.92 mg/dL).    Allergies  Allergen Reactions  . Penicillins Rash    Antimicrobials this admission: Vancomycin 8/2 >>  Zosyn 8/2 x 1 dose in ED  Dose adjustments this admission:  Microbiology results:  Thank you for allowing pharmacy to be a part of this patient's care.  Clovia Cuff, PharmD, BCPS 12/21/2015 2:56 PM

## 2015-12-21 NOTE — Progress Notes (Signed)
Called orderly Kathlene November to look for a K pad per MD order and pt's request. No K pad available in the entire hospital as of this time per [facility]. Pt was informed of the above but reassured that he will be given one as soon as there is available. Pt requested to be on the chair this time. Chair alarm activated, call bell within reach.

## 2015-12-21 NOTE — Progress Notes (Signed)
Subjective :Patient is day 2 admission for cellulitis left hand. Patient reports pain as much better..    Patient states that the redness and swelling has significant improvement since his admission. Was involving the whole hand on of the arm as well as the dorsal and volar surface but now is mostly localized to the middle finger of left hand.  Objective: Vital signs in last 24 hours: Temp:  [98 F (36.7 C)-99.3 F (37.4 C)] 98.6 F (37 C) (08/03 0441) Pulse Rate:  [37-105] 56 (08/03 0441) Resp:  [17-20] 19 (08/03 0441) BP: (125-154)/(65-101) 125/80 (08/03 0441) SpO2:  [96 %-100 %] 98 % (08/03 0441) Weight:  [92.5 kg (204 lb)-95.1 kg (209 lb 11.2 oz)] 95.1 kg (209 lb 11.2 oz) (08/02 2136) Wound check clean, no drainage and erythema no sensory deficits noted Mild swelling noted to the left middle finger. Range of motion of fingers of left hand limited at this time but is improving. Capillary refill intact and within normal limits     Intake/Output from previous day: 08/02 0701 - 08/03 0700 In: 600 [P.O.:600] Out: -  Intake/Output this shift: No intake/output data recorded.   Recent Labs  12/20/15 1845 12/21/15 0342  HGB 17.6 15.7    Recent Labs  12/20/15 1845 12/21/15 0342  WBC 8.2 8.6  RBC 5.65 5.07  HCT 50.6 45.1  PLT 131* 131*    Recent Labs  12/20/15 1845 12/21/15 0342  NA 132* 133*  K 4.2 3.8  CL 94* 98*  CO2 31 29  BUN 18 18  CREATININE 0.99 0.92  GLUCOSE 256* 147*  CALCIUM 9.0 8.5*   No results for input(s): LABPT, INR in the last 72 hours.  Neurologically intact Neurovascular intact Sensation intact distally Intact pulses distally Compartment soft  Assessment/Plan: Continue with IV antibiotic. At this time surgery is not indicated since he admits so much improvement in the last 12 hours. Case management to assist with discharge planning Bowel movement today Plan to discharge Friday or Saturday on oral antibiotics   Maleeha Halls  R. 12/21/2015, 7:43 AM

## 2015-12-21 NOTE — Progress Notes (Signed)
Pt claimed he has a chronic back pain, using an electric warming pad at home, stated he "couldn't sleep" on the bed and requested for a K pad tonight. He also requested that Ambien 10mg  should be part of his night time medicines since he has been taking it at home. On call MD Anne Hahn paged and made aware, ordered for K pad and Ambien 10mg  every HS. Will carry out orders and continue to monitor.

## 2015-12-21 NOTE — Progress Notes (Signed)
Called other units for a K pad but either in use or nothing is available. Will pass on to the incoming shift to follow up.

## 2015-12-22 LAB — HEPATITIS C ANTIBODY: HCV Ab: 0.1 s/co ratio (ref 0.0–0.9)

## 2015-12-22 LAB — GLUCOSE, CAPILLARY
GLUCOSE-CAPILLARY: 89 mg/dL (ref 65–99)
GLUCOSE-CAPILLARY: 138 mg/dL — AB (ref 65–99)

## 2015-12-22 MED ORDER — SULFAMETHOXAZOLE-TRIMETHOPRIM 800-160 MG PO TABS: 1.0000 | ORAL_TABLET | Freq: Two times a day (BID) | ORAL | 0 refills | Status: DC

## 2015-12-22 NOTE — Care Management Note (Signed)
Case Management Note  Patient Details  Name: EVERHETT SALSMAN MRN: [id number] Date of Birth: Jul 10, 1950  Subjective/Objective:                    Action/Plan:Discharge to home with self care.  Signed off   Expected Discharge Date:                  Expected Discharge Plan:  Home/Self Care  In-House Referral:     Discharge planning Services  CM Consult  Post Acute Care Choice:    Choice offered to:     DME Arranged:    DME Agency:     HH Arranged:    HH Agency:     Status of Service:  Completed, signed off  If discussed at [employer] of Stay Meetings, dates discussed:    Additional Comments:  Adonis Huguenin, RN 12/22/2015, 11:00 AM

## 2015-12-22 NOTE — Care Management Important Message (Signed)
Important Message  Patient Details  Name: Mark Armstrong MRN: [id number] Date of Birth: 07-10-1950   Medicare Important Message Given:  N/A - LOS <3 / Initial given by admissions    Adonis Huguenin, RN 12/22/2015, 9:34 AM

## 2015-12-22 NOTE — Discharge Summary (Signed)
[facility] [place] at [facility]   PATIENT NAME: Mark Armstrong    MR#:  [id number]  DATE OF BIRTH:  12-05-1950  DATE OF ADMISSION:  12/20/2015 ADMITTING PHYSICIAN: Alford Highland, MD  DATE OF DISCHARGE: 12/22/2015  PRIMARY CARE PHYSICIAN: No primary care provider on file.    ADMISSION DIAGNOSIS:  Cellulitis of left upper extremity [L03.114]  DISCHARGE DIAGNOSIS:  Active Problems:   Cellulitis   SECONDARY DIAGNOSIS:   Past Medical History:  Diagnosis Date  . 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 CHF (congestive heart failure) (HCC)   . Diabetes mellitus without complication (HCC)   . Glaucoma   . Hypertension   . Stroke [facility])     HOSPITAL COURSE:  65 year old male with a history of essential hypertension and diabetes who presented to the hospital with left hand cellulitis.  1 left hand cellulitis: Patient was started on IV vancomycin. Wound cultures are growing out GPC. Patient was evaluated by orthopedic surgery. No plan for surgical intervention. Patient's cellulitis has improved. Follow-up with orthopedic surgery in 1-2 weeks.  2. History of chronic diastolic heart failure: Patient is not in exacerbation. Continue Coreg, Lasix and lisinopril.  3. diabetes without complication: Patient will resume ADA diet and Lantus.  4. Diabetic neuropathy: Continue Lyrica.  5. History of CVA: Continue Plavix and statin.   DISCHARGE CONDITIONS AND DIET:   Stable for discharge  CONSULTS OBTAINED:  Treatment Team:  Donato Heinz, MD  DRUG ALLERGIES:   Allergies  Allergen Reactions  . Penicillins Rash    DISCHARGE MEDICATIONS:   Current Discharge Medication List    START taking these medications   Details  sulfamethoxazole-trimethoprim (BACTRIM DS,SEPTRA DS) 800-160 MG tablet Take 1 tablet by mouth 2 (two) times daily. Qty: 20 tablet, Refills: 0      CONTINUE these medications which have NOT CHANGED   Details  amitriptyline (ELAVIL) 150 MG  tablet Take 150 mg by mouth daily.    aspirin 81 MG tablet Take 81 mg by mouth daily.    atorvastatin (LIPITOR) 80 MG tablet Take 80 mg by mouth daily.    carvedilol (COREG) 3.125 MG tablet Take 3.125 mg by mouth 2 (two) times daily.    clopidogrel (PLAVIX) 75 MG tablet Take 75 mg by mouth daily.    colchicine 0.6 MG tablet Take 0.6 mg by mouth 2 (two) times daily.    cyclobenzaprine (FLEXERIL) 10 MG tablet Take 10 mg by mouth 2 (two) times daily as needed.    esomeprazole (NEXIUM) 40 MG capsule Take 40 mg by mouth daily.    furosemide (LASIX) 40 MG tablet Take 40 mg by mouth 2 (two) times daily. Refills: 1    gemfibrozil (LOPID) 600 MG tablet Take 600 mg by mouth 2 (two) times daily.    glipiZIDE (GLUCOTROL XL) 10 MG 24 hr tablet Take 20 mg by mouth daily.    insulin glargine (LANTUS) 100 UNIT/ML injection Inject 50 Units into the skin at bedtime.    lisinopril (PRINIVIL,ZESTRIL) 5 MG tablet Take 5 mg by mouth daily.    metFORMIN (GLUCOPHAGE) 500 MG tablet Take 500 mg by mouth 2 (two) times daily with a meal.    potassium chloride SA (K-DUR,KLOR-CON) 20 MEQ tablet Take 20 mEq by mouth daily.    pregabalin (LYRICA) 100 MG capsule Take 100 mg by mouth 3 (three) times daily.              Today   CHIEF COMPLAINT:  on file and well this morning. Ready for discharge. Erythema has improved.  VITAL SIGNS:  Blood pressure (!) 107/59, pulse 100, temperature 99.1 F (37.3 C), temperature source Oral, resp. rate 16, height  (1.854 m), weight 95.1 kg (209 lb 11.2 oz), SpO2 96 %.   REVIEW OF SYSTEMS:  Review of Systems  Constitutional: Negative.  Negative for chills, fever and malaise/fatigue.  HENT: Negative.  Negative for ear discharge, ear pain, hearing loss, nosebleeds and sore throat.   Eyes: Negative.  Negative for blurred vision and pain.  Respiratory: Negative.  Negative for cough, hemoptysis, shortness of breath and wheezing.   Cardiovascular: Negative.   Negative for chest pain, palpitations and leg swelling.  Gastrointestinal: Negative.  Negative for abdominal pain, blood in stool, diarrhea, nausea and vomiting.  Genitourinary: Negative.  Negative for dysuria.  Musculoskeletal: Negative.  Negative for back pain.  Skin:       Hand is better less erythematous and less edema.  Neurological: Negative for dizziness, tremors, speech change, focal weakness, seizures and headaches.  Endo/Heme/Allergies: Negative.  Does not bruise/bleed easily.  Psychiatric/Behavioral: Negative.  Negative for depression, hallucinations and suicidal ideas.     PHYSICAL EXAMINATION:  GENERAL:  65 y.o.-year-old patient lying in the bed with no acute distress.  NECK:  Supple, no jugular venous distention. No thyroid enlargement, no tenderness.  LUNGS: Normal breath sounds bilaterally, no wheezing, rales,rhonchi  No use of accessory muscles of respiration.  CARDIOVASCULAR: S1, S2 normal. No murmurs, rubs, or gallops.  ABDOMEN: Soft, non-tender, non-distended. Bowel sounds present. No organomegaly or mass.  EXTREMITIES: No pedal edema, cyanosis, or clubbing.  PSYCHIATRIC: The patient is alert and oriented x 3.  SKIN: On left third finger there is a laceration without any drainage. There is erythema around the anger and hand. He has full range of motion of fingers and wrists.  NO Area of fluctuance.    DATA REVIEW:   CBC  Recent Labs Lab 12/21/15 0342  WBC 8.6  HGB 15.7  HCT 45.1  PLT 131*    Chemistries   Recent Labs Lab 12/20/15 1845 12/21/15 0342  NA 132* 133*  K 4.2 3.8  CL 94* 98*  CO2 31 29  GLUCOSE 256* 147*  BUN 18 18  CREATININE 0.99 0.92  CALCIUM 9.0 8.5*  AST 20  --   ALT 15*  --   ALKPHOS 88  --   BILITOT 1.2  --     Cardiac Enzymes No results for input(s): TROPONINI in the last 168 hours.  Microbiology Results  @  RADIOLOGY:  No results found.    Management plans discussed with the patient and he is in  agreement. Stable for discharge home  Patient should follow up with pcp and ortho  CODE STATUS:     Code Status Orders        Start     Ordered   12/20/15 2022  Full code  Continuous     12/20/15 2022    Code Status History    Date Active Date Inactive Code Status Order ID Comments User Context   This patient has a current code status but no historical code status.      TOTAL TIME TAKING CARE OF THIS PATIENT: 36 minutes.    Note: This dictation was prepared with Dragon dictation along with smaller phrase technology. Any transcriptional errors that result from this process are unintentional.  Hansika Leaming M.D on 12/22/2015 at 12:47 PM  Between 7am to 6pm -  Pager - [phone] After 6pm go to [email or web address] - Social research officer, government  [facility]  Office  [phone]  CC: Primary care physician; No primary care provider on file.

## 2015-12-22 NOTE — Progress Notes (Signed)
Subjective :Patient is day 3 admission for cellulitis to left hand Patient reports pain as mild.   no nausea and no vomiting pt please with improvement. still sore.     Objective: Vital signs in last 24 hours: Temp:  [97.4 F (36.3 C)-98 F (36.7 C)] 97.7 F (36.5 C) (08/04 0325) Pulse Rate:  [57-105] 105 (08/04 0325) Resp:  [16-20] 16 (08/04 0325) BP: (110-132)/(71-105) 132/81 (08/04 0325) SpO2:  [94 %-100 %] 98 % (08/04 0325) Laceration to left long finger shows no drainage and erythema no sensory deficits noted Swelling continues to improve Working fingers well and appears to have FROM  Intake/Output from previous day: 08/03 0701 - 08/04 0700 In: 360 [P.O.:360] Out: -  Intake/Output this shift: No intake/output data recorded.   Recent Labs  12/20/15 1845 12/21/15 0342  HGB 17.6 15.7    Recent Labs  12/20/15 1845 12/21/15 0342  WBC 8.2 8.6  RBC 5.65 5.07  HCT 50.6 45.1  PLT 131* 131*    Recent Labs  12/20/15 1845 12/21/15 0342  NA 132* 133*  K 4.2 3.8  CL 94* 98*  CO2 31 29  BUN 18 18  CREATININE 0.99 0.92  GLUCOSE 256* 147*  CALCIUM 9.0 8.5*   No results for input(s): LABPT, INR in the last 72 hours.  Neurologically intact  Assessment/Plan: Switch to oral antibiotic when ready Case management to assist with discharge planning Occupational therapy today Bowel movement today F/u in [facility] in 1 week Keep wound clean and dry.   WOLFE,JON R. 12/22/2015, 7:12 AM

## 2015-12-26 LAB — AEROBIC/ANAEROBIC CULTURE W GRAM STAIN (SURGICAL/DEEP WOUND): Special Requests: NORMAL

## 2015-12-26 LAB — AEROBIC/ANAEROBIC CULTURE (SURGICAL/DEEP WOUND)

## 2015-12-27 ENCOUNTER — Ambulatory Visit
Admission: RE | Admit: 2015-12-27 | Discharge: 2015-12-27 | Disposition: A | Discharge status: Home / Self Care | Payer: Medicare Other | Source: Ambulatory Visit | Attending: Orthopedic Surgery | Admitting: Orthopedic Surgery

## 2015-12-27 ENCOUNTER — Ambulatory Visit: Payer: Medicare Other | Admitting: Anesthesiology

## 2015-12-27 ENCOUNTER — Encounter
Admission: RE | Disposition: A | Discharge status: Home / Self Care | Payer: Self-pay | Source: Ambulatory Visit | Attending: Orthopedic Surgery

## 2015-12-27 ENCOUNTER — Encounter: Payer: Self-pay | Admitting: *Deleted

## 2015-12-27 DIAGNOSIS — G709 Myoneural disorder, unspecified: Secondary | ICD-10-CM | POA: Insufficient documentation

## 2015-12-27 DIAGNOSIS — H409 Unspecified glaucoma: Secondary | ICD-10-CM | POA: Insufficient documentation

## 2015-12-27 DIAGNOSIS — L02512 Cutaneous abscess of left hand: Secondary | ICD-10-CM | POA: Diagnosis not present

## 2015-12-27 DIAGNOSIS — Z87891 Personal history of nicotine dependence: Secondary | ICD-10-CM | POA: Diagnosis not present

## 2015-12-27 DIAGNOSIS — S66313A Strain of extensor muscle, fascia and tendon of left middle finger at wrist and hand level, initial encounter: Secondary | ICD-10-CM | POA: Diagnosis not present

## 2015-12-27 DIAGNOSIS — I509 Heart failure, unspecified: Secondary | ICD-10-CM | POA: Insufficient documentation

## 2015-12-27 DIAGNOSIS — Z951 Presence of aortocoronary bypass graft: Secondary | ICD-10-CM | POA: Insufficient documentation

## 2015-12-27 DIAGNOSIS — I10 Essential (primary) hypertension: Secondary | ICD-10-CM | POA: Diagnosis not present

## 2015-12-27 DIAGNOSIS — Z8673 Personal history of transient ischemic attack (TIA), and cerebral infarction without residual deficits: Secondary | ICD-10-CM | POA: Diagnosis not present

## 2015-12-27 DIAGNOSIS — E119 Type 2 diabetes mellitus without complications: Secondary | ICD-10-CM | POA: Insufficient documentation

## 2015-12-27 HISTORY — PX: INCISION AND DRAINAGE WOUND WITH TENDON REPAIR: SHX5639

## 2015-12-27 LAB — POCT I-STAT 4, (NA,K, GLUC, HGB,HCT)
Glucose, Bld: 238 mg/dL — ABNORMAL HIGH (ref 65–99)
HCT: 49 % (ref 39.0–52.0)
HEMOGLOBIN: 16.7 g/dL (ref 13.0–17.0)
Potassium: 4.2 mmol/L (ref 3.5–5.1)
SODIUM: 136 mmol/L (ref 135–145)

## 2015-12-27 LAB — GLUCOSE, CAPILLARY
GLUCOSE-CAPILLARY: 260 mg/dL — AB (ref 65–99)
Glucose-Capillary: 212 mg/dL — ABNORMAL HIGH (ref 65–99)

## 2015-12-27 SURGERY — INCISION AND DRAINAGE WOUND WITH TENDON REPAIR
Anesthesia: General | Site: Finger | Laterality: Left

## 2015-12-27 MED ORDER — FENTANYL CITRATE (PF) 100 MCG/2ML IJ SOLN
INTRAMUSCULAR | Status: AC
  Administered 2015-12-27: 25 ug via INTRAVENOUS
  Filled 2015-12-27: qty 2

## 2015-12-27 MED ORDER — PHENYLEPHRINE HCL 10 MG/ML IJ SOLN
INTRAMUSCULAR | Status: DC | PRN
  Administered 2015-12-27: 100 ug via INTRAVENOUS
  Administered 2015-12-27 (×3): 150 ug via INTRAVENOUS

## 2015-12-27 MED ORDER — SODIUM CHLORIDE 0.9 % IV SOLN: INTRAVENOUS | Status: DC

## 2015-12-27 MED ORDER — METOCLOPRAMIDE HCL 10 MG PO TABS: 5.0000 mg | ORAL_TABLET | Freq: Three times a day (TID) | ORAL | Status: DC | PRN

## 2015-12-27 MED ORDER — ONDANSETRON HCL 4 MG PO TABS: 4.0000 mg | ORAL_TABLET | Freq: Four times a day (QID) | ORAL | Status: DC | PRN

## 2015-12-27 MED ORDER — CLINDAMYCIN PHOSPHATE 900 MG/50ML IV SOLN
INTRAVENOUS | Status: AC
  Filled 2015-12-27: qty 50

## 2015-12-27 MED ORDER — FENTANYL CITRATE (PF) 100 MCG/2ML IJ SOLN
25.0000 ug | Freq: Once | INTRAMUSCULAR | Status: AC
Start: 2015-12-27 — End: 2015-12-27
  Administered 2015-12-27: 25 ug via INTRAVENOUS

## 2015-12-27 MED ORDER — FENTANYL CITRATE (PF) 100 MCG/2ML IJ SOLN
INTRAMUSCULAR | Status: DC | PRN
  Administered 2015-12-27: 25 ug via INTRAVENOUS

## 2015-12-27 MED ORDER — NEOMYCIN-POLYMYXIN B GU 40-200000 IR SOLN
Status: DC | PRN
  Administered 2015-12-27: 2 mL

## 2015-12-27 MED ORDER — ONDANSETRON HCL 4 MG/2ML IJ SOLN: 4.0000 mg | Freq: Four times a day (QID) | INTRAMUSCULAR | Status: DC | PRN

## 2015-12-27 MED ORDER — METOCLOPRAMIDE HCL 5 MG/ML IJ SOLN: 5.0000 mg | Freq: Three times a day (TID) | INTRAMUSCULAR | Status: DC | PRN

## 2015-12-27 MED ORDER — HYDROCODONE-ACETAMINOPHEN 5-325 MG PO TABS: 1.0000 | ORAL_TABLET | ORAL | Status: DC | PRN

## 2015-12-27 MED ORDER — HYDROCODONE-ACETAMINOPHEN 5-325 MG PO TABS: 1.0000 | ORAL_TABLET | Freq: Four times a day (QID) | ORAL | 0 refills | Status: DC | PRN

## 2015-12-27 MED ORDER — INSULIN ASPART 100 UNIT/ML ~~LOC~~ SOLN
5.0000 [IU] | Freq: Once | SUBCUTANEOUS | Status: AC
  Administered 2015-12-27: 5 [IU] via SUBCUTANEOUS

## 2015-12-27 MED ORDER — CARVEDILOL 3.125 MG PO TABS
3.1250 mg | ORAL_TABLET | Freq: Once | ORAL | Status: AC
  Administered 2015-12-27: 3.125 mg via ORAL
  Filled 2015-12-27 (×2): qty 1

## 2015-12-27 MED ORDER — PROPOFOL 10 MG/ML IV BOLUS
INTRAVENOUS | Status: DC | PRN
  Administered 2015-12-27: 200 mg via INTRAVENOUS

## 2015-12-27 MED ORDER — INSULIN ASPART 100 UNIT/ML ~~LOC~~ SOLN
SUBCUTANEOUS | Status: AC
  Filled 2015-12-27: qty 5

## 2015-12-27 MED ORDER — SODIUM CHLORIDE 0.9 % IV SOLN
INTRAVENOUS | Status: DC
  Administered 2015-12-27 (×2): via INTRAVENOUS

## 2015-12-27 MED ORDER — FENTANYL CITRATE (PF) 100 MCG/2ML IJ SOLN: 25.0000 ug | INTRAMUSCULAR | Status: DC | PRN

## 2015-12-27 MED ORDER — HYDROCODONE-ACETAMINOPHEN 5-325 MG PO TABS
1.0000 | ORAL_TABLET | ORAL | Status: DC | PRN
Start: 2015-12-27 — End: 2015-12-27

## 2015-12-27 MED ORDER — OXYCODONE HCL 5 MG/5ML PO SOLN: 5.0000 mg | Freq: Once | ORAL | Status: DC | PRN

## 2015-12-27 MED ORDER — OXYCODONE HCL 5 MG PO TABS: 5.0000 mg | ORAL_TABLET | Freq: Once | ORAL | Status: DC | PRN

## 2015-12-27 MED ORDER — ONDANSETRON HCL 4 MG/2ML IJ SOLN
INTRAMUSCULAR | Status: DC | PRN
Start: 2015-12-27 — End: 2015-12-27
  Administered 2015-12-27: 4 mg via INTRAVENOUS

## 2015-12-27 MED ORDER — NEOMYCIN-POLYMYXIN B GU 40-200000 IR SOLN
Status: AC
  Filled 2015-12-27: qty 2

## 2015-12-27 MED ORDER — CLINDAMYCIN PHOSPHATE 900 MG/50ML IV SOLN
900.0000 mg | Freq: Once | INTRAVENOUS | Status: AC
  Administered 2015-12-27: 900 mg via INTRAVENOUS

## 2015-12-27 SURGICAL SUPPLY — 26 items
BANDAGE ELASTIC 3 CLIP NS LF (GAUZE/BANDAGES/DRESSINGS) ×4 IMPLANT
BLADE SURG MINI STRL (BLADE) ×4 IMPLANT
BNDG ESMARK 4X12 TAN STRL LF (GAUZE/BANDAGES/DRESSINGS) ×4 IMPLANT
CANISTER SUCT 1200ML W/VALVE (MISCELLANEOUS) ×4 IMPLANT
CAST PADDING 3X4FT ST 30246 (SOFTGOODS) ×2
CHLORAPREP W/TINT 26ML (MISCELLANEOUS) ×4 IMPLANT
CUFF TOURN 18 STER (MISCELLANEOUS) IMPLANT
CUFF TOURN 24 STER (MISCELLANEOUS) IMPLANT
ELECT REM PT RETURN 9FT ADLT (ELECTROSURGICAL) ×4
ELECTRODE REM PT RTRN 9FT ADLT (ELECTROSURGICAL) ×2 IMPLANT
GAUZE PETRO XEROFOAM 1X8 (MISCELLANEOUS) ×4 IMPLANT
GAUZE SPONGE 4X4 12PLY STRL (GAUZE/BANDAGES/DRESSINGS) ×4 IMPLANT
GLOVE BIOGEL PI IND STRL 9 (GLOVE) ×2 IMPLANT
GLOVE BIOGEL PI INDICATOR 9 (GLOVE) ×2
GLOVE SURG ORTHO 9.0 STRL STRW (GLOVE) ×4 IMPLANT
GOWN SRG 2XL LVL 4 RGLN SLV (GOWNS) ×2 IMPLANT
GOWN STRL NON-REIN 2XL LVL4 (GOWNS) ×2
GOWN STRL REUS W/ TWL LRG LVL3 (GOWN DISPOSABLE) ×2 IMPLANT
GOWN STRL REUS W/TWL LRG LVL3 (GOWN DISPOSABLE) ×2
KIT RM TURNOVER STRD PROC AR (KITS) ×4 IMPLANT
NS IRRIG 500ML POUR BTL (IV SOLUTION) ×4 IMPLANT
PACK EXTREMITY ARMC (MISCELLANEOUS) ×4 IMPLANT
PAD CAST CTTN 3X4 STRL (SOFTGOODS) ×2 IMPLANT
PAD PREP 24X41 OB/GYN DISP (PERSONAL CARE ITEMS) ×4 IMPLANT
STOCKINETTE STRL 4IN 9604848 (GAUZE/BANDAGES/DRESSINGS) ×4 IMPLANT
SUT ETHILON 4 0 P 3 18 (SUTURE) ×4 IMPLANT

## 2015-12-27 NOTE — H&P (Signed)
Reviewed paper H+P, will be scanned into chart. No changes noted.  

## 2015-12-27 NOTE — Anesthesia Procedure Notes (Signed)
Procedure Name: LMA Insertion Date/Time: 12/27/2015 5:02 PM Performed by: Omer JackWEATHERLY, Mark Reinheimer Pre-anesthesia Checklist: Patient identified, Patient being monitored, Timeout performed, Emergency Drugs available and Suction available Patient Re-evaluated:Patient Re-evaluated prior to inductionOxygen Delivery Method: Circle system utilized Preoxygenation: Pre-oxygenation with 100% oxygen Intubation Type: IV induction Ventilation: Mask ventilation without difficulty LMA: LMA inserted LMA Size: 5.0 Tube type: Oral Number of attempts: 1 Placement Confirmation: positive ETCO2 and breath sounds checked- equal and bilateral Tube secured with: Tape Dental Injury: Teeth and Oropharynx as per pre-operative assessment

## 2015-12-27 NOTE — Anesthesia Preprocedure Evaluation (Signed)
Anesthesia Evaluation  Patient identified by MRN, date of birth, ID band Patient awake    Reviewed: Allergy & Precautions, H&P , NPO status , Patient's Chart, lab work & pertinent test results  History of Anesthesia Complications Negative for: history of anesthetic complications  Airway Mallampati: III  TM Distance: >3 FB Neck ROM: limited    Dental  (+) Poor Dentition, Missing, Edentulous Upper, Edentulous Lower   Pulmonary neg shortness of breath, former smoker,    Pulmonary exam normal breath sounds clear to auscultation       Cardiovascular Exercise Tolerance: Good hypertension, (-) angina+CHF  Normal cardiovascular exam Rhythm:regular Rate:Normal     Neuro/Psych  Neuromuscular disease CVA, Residual Symptoms negative psych ROS   GI/Hepatic negative GI ROS, Neg liver ROS,   Endo/Other  negative endocrine ROSdiabetes  Renal/GU negative Renal ROS  negative genitourinary   Musculoskeletal   Abdominal   Peds  Hematology negative hematology ROS (+)   Anesthesia Other Findings Past Medical History: No date: Charcot Marie Tooth muscular atrophy No date: CHF (congestive heart failure) (HCC) No date: Diabetes mellitus without complication (HCC) No date: Glaucoma No date: Hypertension No date: Stroke [facility](HCC)  Past Surgical History: No date: CARDIAC SURGERY No date: CORONARY ARTERY BYPASS GRAFT No date: EYE SURGERY No date: Right foot surgery No date: Right knee arthroscopy No date: TOE AMPUTATION     Reproductive/Obstetrics negative OB ROS                             Anesthesia Physical Anesthesia Plan  ASA: III  Anesthesia Plan: General LMA   Post-op Pain Management:    Induction:   Airway Management Planned:   Additional Equipment:   Intra-op Plan:   Post-operative Plan:   Informed Consent: I have reviewed the patients History and Physical, chart, labs and discussed  the procedure including the risks, benefits and alternatives for the proposed anesthesia with the patient or authorized representative who has indicated his/her understanding and acceptance.   Dental Advisory Given  Plan Discussed with: Anesthesiologist, CRNA and Surgeon  Anesthesia Plan Comments:         Anesthesia Quick Evaluation

## 2015-12-27 NOTE — Transfer of Care (Signed)
Immediate Anesthesia Transfer of Care Note  Patient: Mark Armstrong  Procedure(s) Performed: Procedure(s): INCISION AND DRAINAGE WOUND WITH TENDON REPAIR (Left)  Patient Location: PACU  Anesthesia Type:General  Level of Consciousness: sedated  Airway & Oxygen Therapy: Patient Spontanous Breathing and Patient connected to face mask oxygen  Post-op Assessment: VSS  Post vital signs: Reviewed and stable  Last Vitals:  Vitals:   12/27/15 1626 12/27/15 1739  BP:  (!) 105/59  Pulse: (!) 50 (!) 46  Resp:  19  Temp:  36.6 C    Last Pain:  Vitals:   12/27/15 1739  TempSrc:   PainSc: Asleep         Complications: No apparent anesthesia complications

## 2015-12-27 NOTE — Op Note (Signed)
12/27/2015  5:31 PM  PATIENT:  Mark Armstrong  65 y.o. male  PRE-OPERATIVE DIAGNOSIS:  abscess left middle finger  POST-OPERATIVE DIAGNOSIS:  abscess left middle finger extensor tendon rupture  PROCEDURE:  Procedure(s): INCISION AND DRAINAGE WOUND WITH TENDON REPAIR (Left)  SURGEON: Leitha SchullerMichael J Yvett Rossel, MD  ASSISTANTS: None  ANESTHESIA:   general  EBL:  Total I/O In: 500 [I.V.:500] Out: 10 [Blood:10]  BLOOD ADMINISTERED:none  DRAINS: none   LOCAL MEDICATIONS USED:  NONE  SPECIMEN:  No Specimen  DISPOSITION OF SPECIMEN:  N/A  COUNTS:  YES  TOURNIQUET:  * No tourniquets in log *  IMPLANTS: None  DICTATION: .Dragon Dictation patient brought the operating room and after adequate general anesthesia was obtained left arm was prepped and draped in sterile fashion. After patient identification timeout procedures were completed an oblique incision was made over the dorsum of the middle phalanx and an area of fluctuance and there is fluid collection was drained which appeared to be consistent with chronic infection. The extensor tendon was disrupted over the PIP joint and appeared to have ruptured secondary to infection. The wound was thoroughly irrigated and is her there was no gross pus tendon repair was performed with a chair placed through the skin and then back into the tendon brought forward into the distal tendon and then out through the skin with a 3-0 nylon that brought the finger into normal passive position. The wound was then closed with simple interrupted 4-0 nylon in a loose fashion to allow for drainage the hand was then splinted with the fingers in extension and patient center comes stable condition  PLAN OF CARE: Discharge to home after PACU  PATIENT DISPOSITION:  PACU - hemodynamically stable.

## 2015-12-27 NOTE — Discharge Instructions (Signed)

## 2015-12-28 ENCOUNTER — Encounter: Payer: Self-pay | Admitting: Orthopedic Surgery

## 2015-12-28 NOTE — Anesthesia Postprocedure Evaluation (Signed)
Anesthesia Post Note  Patient: Mark Armstrong  Procedure(s) Performed: Procedure(s) (LRB): INCISION AND DRAINAGE WOUND WITH TENDON REPAIR (Left)  Patient location during evaluation: PACU Anesthesia Type: General Level of consciousness: awake and alert and oriented Pain management: pain level controlled Vital Signs Assessment: post-procedure vital signs reviewed and stable Respiratory status: spontaneous breathing Cardiovascular status: blood pressure returned to baseline Anesthetic complications: no    Last Vitals:  Vitals:   12/27/15 1850 12/27/15 1851  BP:  127/61  Pulse: 88   Resp: 16   Temp:      Last Pain:  Vitals:   12/27/15 1850  TempSrc:   PainSc: 0-No pain                 Braelin Costlow

## 2016-01-09 ENCOUNTER — Ambulatory Visit: Payer: Medicare Other | Attending: Orthopedic Surgery | Admitting: Occupational Therapy

## 2016-01-09 DIAGNOSIS — S66922S Laceration of unspecified muscle, fascia and tendon at wrist and hand level, left hand, sequela: Secondary | ICD-10-CM | POA: Diagnosis present

## 2016-01-09 DIAGNOSIS — X58XXXS Exposure to other specified factors, sequela: Secondary | ICD-10-CM | POA: Insufficient documentation

## 2016-01-09 DIAGNOSIS — S66822S Laceration of other specified muscles, fascia and tendons at wrist and hand level, left hand, sequela: Secondary | ICD-10-CM

## 2016-01-09 DIAGNOSIS — S61402S Unspecified open wound of left hand, sequela: Secondary | ICD-10-CM

## 2016-01-09 NOTE — Patient Instructions (Signed)
Hand base extention splint to 3rd fabricated and achored at wrist so splint cannot slide off  Pt , wife and friend ed on wearing , precautions of splint - AND NO taking off or doing any ROM to finger - or bending it  Pt following up with surgeon tomorrow

## 2016-01-09 NOTE — Therapy (Signed)
[facility] [address] [place], [place], [address] Phone: [phone]   Fax:  [phone]  Occupational Therapy Evaluation  Patient Details  Name: Rosina LowensteinBoyd D Madero MRN: [id number] Date of Birth: [birth year] Referring Provider: Rosita KeaMenz  Encounter Date: 01/09/2016      OT End of Session - 01/09/16 1630    Visit Number 1   Number of Visits 1   Date for OT Re-Evaluation 01/09/16   OT Start Time 1008   OT Stop Time 1101   OT Time Calculation (min) 53 min   Activity Tolerance Treatment limited secondary to medical complications (Comment)   Behavior [facility] tasks assessed/performed      Past Medical History:  Diagnosis Date  . 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 CHF (congestive heart failure) (HCC)   . Diabetes mellitus without complication (HCC)   . Glaucoma   . Hypertension   . Stroke [facility](HCC)     Past Surgical History:  Procedure Laterality Date  . CARDIAC SURGERY    . CORONARY ARTERY BYPASS GRAFT    . EYE SURGERY    . INCISION AND DRAINAGE WOUND WITH TENDON REPAIR Left 12/27/2015   Procedure: INCISION AND DRAINAGE WOUND WITH TENDON REPAIR;  Surgeon: Kennedy BuckerMichael Menz, MD;  Location: [facility] ORS;  Service: Orthopedics;  Laterality: Left;  . Right foot surgery    . Right knee arthroscopy    . TOE AMPUTATION      There were no vitals filed for this visit.      Subjective Assessment - 01/09/16 1622    Subjective  Wife and pt report he fell this am and busted his stitches open - bleeding - also that he is not keeping splint on that Dr office gave and finger bended upon arrival    Patient is accompained by: Family member   Currently in Pain? Yes   Pain Score 5    Pain Location Finger (Comment which one)   Pain Orientation Left   Pain Descriptors / Indicators Aching   Pain Type Surgical pain           [facility] OT Assessment - 01/09/16 0001      Assessment   Diagnosis Abscess L 3rd and ext tendon  repair mid phalanges   Referring Provider Menz   Onset Date 12/27/15   Assessment Pt arrive with bandage on 3rd , finger in flexion at PIP - and  bleeding thru bandage      Precautions   Precaution Comments NO ROM to 3rd for 5-6 wks    Required Braces or Orthoses --  Extention splint to 3rd     Balance Screen   Has the patient fallen in the past 6 months Yes   How many times? 4   Has the patient had a decrease in activity level because of a fear of falling?  Yes   Is the patient reluctant to leave their home because of a fear of falling?  --  don't know     Home  Environment   Lives With Spouse     Prior Function   Level of Independence Independent   Vocation Retired   Leisure Scientist, physiologicalwatch tv      Dressing changes done - pt and family ed  Splint   Hand base extention splint to 3rd fabricated and achored at wrist so splint cannot slide off  Pt , wife and friend ed on wearing , precautions of splint -  AND NO taking off or doing any ROM to finger - or bending it  Pt following up with surgeon tomorrow                   OT Education - 01/09/16 1629    Education provided Yes   Education Details splint wearing, precautions for surgery -    Person(s) Educated Patient;Spouse;Other (comment)   Methods Explanation;Demonstration;Tactile cues;Verbal cues   Comprehension Verbal cues required;Returned demonstration;Verbalized understanding             OT Long Term Goals - 01/09/16 1643      OT LONG TERM GOAL #1   Title Pt and caregivers verbalize understanding of wearing of splint and precautions    Status Achieved               Plan - 01/09/16 1631    Clinical Impression Statement Pt present 13 days s/p L 3rd extensor tendon repair Zone 3 with open wound from abscess - pt since injury 12/18/15   on 2-3 rounds of antibiotics -  pt arrive this date with  3rd finger in flexion at PIP , stitches busted open - bleeding thru bandages and no splint on- per pt and  family  he not wearing or keeping on  splint got at MD office and when they made appt  at this clinic with office staff did not  tell them that they need splint  ASAP - pt fell 4-5 x the last 6 months and  last one this AM  - phoned Dr Rosita KeaMenz - who was in surgery  - they offered appt this afternoon with PA - but pt and famly wants to see Dr Rosita KeaMenz - Stitches in tact but wound gapping over dorsal PIP - Xero form , and gauze dressing done with  stockinette  for digit - and fabricated  hand base extention splint for 3rd  - with achor around wrist - pt and famly ed on not  taking if off and keep 3rd in extention until follow up with DR Rosita KeaMenz tomorrow - phone if  any issues    Rehab Potential Fair   Clinical Impairments Affecting Rehab Potential pt lethargic, decrease memory , and follow directions - attempt several times to bend finger or move after  specificly told by OT to keep hand on table and finger straight    OT Frequency 1x / week   OT Duration 8 weeks   OT Treatment/Interventions Self-care/ADL training;Splinting;Patient/family education   Plan Pt to follow up with Dr Rosita KeaMenz tomorrow    OT Home Exercise Plan splint on - no flexion of 3rd digit   Consulted and Agree with Plan of Care Patient;Family member/caregiver;Other (Comment)  friend   Family Member Consulted wife      Patient will benefit from skilled therapeutic intervention in order to improve the following deficits and impairments:  Decreased skin integrity, Decreased range of motion  Visit Diagnosis: Extensor tendon laceration of hand with open wound, left, sequela - Plan: Ot plan of care cert/re-cert    Problem List Patient Active Problem List   Diagnosis Date Noted  . Cellulitis 12/20/2015    Oletta CohnuPreez, Saraann Enneking OTR/L,CLT  01/09/2016, 4:49 PM   [facility] [address] , [place], [address] Phone: [phone]   Fax:  [phone]  Name: Rosina LowensteinBoyd D Paynter MRN:  [id number] Date of Birth: Aug 19, 1950

## 2016-01-30 ENCOUNTER — Ambulatory Visit: Payer: Medicare Other | Attending: Orthopedic Surgery | Admitting: Occupational Therapy

## 2016-01-30 DIAGNOSIS — S66922S Laceration of unspecified muscle, fascia and tendon at wrist and hand level, left hand, sequela: Secondary | ICD-10-CM | POA: Diagnosis not present

## 2016-01-30 DIAGNOSIS — S61402S Unspecified open wound of left hand, sequela: Secondary | ICD-10-CM

## 2016-01-30 DIAGNOSIS — S66822S Laceration of other specified muscles, fascia and tendons at wrist and hand level, left hand, sequela: Secondary | ICD-10-CM | POA: Insufficient documentation

## 2016-01-30 DIAGNOSIS — X58XXXS Exposure to other specified factors, sequela: Secondary | ICD-10-CM | POA: Insufficient documentation

## 2016-01-30 NOTE — Therapy (Signed)
[facility] PHYSICAL AND SPORTS MEDICINE [address] [place], [place], [address] Phone: [phone]   Fax:  [phone]  Occupational Therapy Treatment  Patient Details  Name: Mark Armstrong MRN: [id number] Date of Birth: 06/13/1950 Referring Provider: Rosita KeaMenz  Encounter Date: 01/30/2016      OT End of Session - 01/30/16 1206    Visit Number 2   Number of Visits 2   Date for OT Re-Evaluation 01/30/16   OT Start Time 0818   OT Stop Time 0905   OT Time Calculation (min) 47 min   Activity Tolerance Patient tolerated treatment well;Treatment limited secondary to medical complications (Comment)   Behavior During Therapy [facility] for tasks assessed/performed      Past Medical History:  Diagnosis Date  . 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 CHF (congestive heart failure) (HCC)   . Diabetes mellitus without complication (HCC)   . Glaucoma   . Hypertension   . Stroke [facility](HCC)     Past Surgical History:  Procedure Laterality Date  . CARDIAC SURGERY    . CORONARY ARTERY BYPASS GRAFT    . EYE SURGERY    . INCISION AND DRAINAGE WOUND WITH TENDON REPAIR Left 12/27/2015   Procedure: INCISION AND DRAINAGE WOUND WITH TENDON REPAIR;  Surgeon: Kennedy BuckerMichael Menz, MD;  Location: [facility] ORS;  Service: Orthopedics;  Laterality: Left;  . Right foot surgery    . Right knee arthroscopy    . TOE AMPUTATION      There were no vitals filed for this visit.      Subjective Assessment - 01/30/16 1204    Subjective  Dr Rosita KeaMenz told us last week to come by and have you make splint more straight - same and different one - I have appt with Dr Rosita KeaMenz tomorrow    Patient Stated Goals Want my finger working again    Currently in Pain? Yes   Pain Score 3    Pain Location Finger (Comment which one)   Pain Orientation Left   Pain Descriptors / Indicators Aching     PROM to L 3rd PIP - band aid removed - prolonged extention stretch done  pt ed on holding it in extention while  fabricating splint  Did finger base gutter PIP extention splint - could not to clam shell because  Of still draining on dorsal side  Proximal and distal strap  - appear that will stay in place and not slide   But for safety - and sleeping - modified hand base splint to increase extention at PIP of 3rd  And reinforce volarly - with bar to increase strength to maintain PIP extention - was not able to get pt in full extention  Pt and friend ed on wearing of splint and precautions                           OT Education - 01/30/16 1206    Education provided Yes   Education Details splint wearing    Person(s) Educated Patient;Other (comment)   Methods Explanation;Demonstration;Tactile cues;Verbal cues   Comprehension Verbal cues required;Returned demonstration;Verbalized understanding             OT Long Term Goals - 01/30/16 1212      OT LONG TERM GOAL #1   Title Pt and caregivers verbalize understanding of wearing of splint and precautions    Status Achieved  Plan - 01/30/16 1207    Clinical Impression Statement Pt arrive this am with splint previous made - off - friend brought pt and brought splint - digit ROM assess and PROM done for extnetion - dorsal PIP wound still draining - has bandaid on it - replaced -  fabricated  digit gutter splint and hand base one - reinforce this date to digit part of hand base to prevent splint from  flexing - pt and friend ed on wearing - pt report he fell 3 x since last seen 8/22 -  last one this am - he has neuropathing and CHF - but pt has hard time sit>stand - unsteady on legs - order send to  PCP  recommending PT eval and tx for balance  - pt to see Dr Rosita Kea tomorrow for  L hand    Rehab Potential Fair   Clinical Impairments Affecting Rehab Potential pt lethargic, decrease memory , and follow directions - attempt several times to bend finger or move after  specificly told by OT to keep hand on table and  finger straight    OT Treatment/Interventions Self-care/ADL training;Splinting;Patient/family education   Plan pt to see Dr Rosita Kea tomorrow   OT Home Exercise Plan splint on - no flexion of 3rd digit   Consulted and Agree with Plan of Care Patient;Other (Comment)      Patient will benefit from skilled therapeutic intervention in order to improve the following deficits and impairments:  Decreased skin integrity, Decreased range of motion  Visit Diagnosis: Extensor tendon laceration of hand with open wound, left, sequela    Problem List Patient Active Problem List   Diagnosis Date Noted  . Cellulitis 12/20/2015    Oletta Cohn OTR/L,CLT 01/30/2016, 12:13 PM  [facility] PH[facility]ICAL [facility] SP[facility]TS MEDICINE [address] [place], [address] Phone: [phone]   Fax:  [phone]  Name: Mark Armstrong MRN: [id number] Date of Birth: 1951/04/12

## 2016-01-30 NOTE — Patient Instructions (Signed)
Pt to keep splint on at all time - per Dr Rosita KeaMenz orders

## 2016-04-18 ENCOUNTER — Encounter: Payer: Self-pay | Admitting: Emergency Medicine

## 2016-04-18 ENCOUNTER — Emergency Department: Payer: Medicare Other

## 2016-04-18 ENCOUNTER — Observation Stay
Admission: EM | Admit: 2016-04-18 | Discharge: 2016-04-19 | Disposition: A | Discharge status: Home / Self Care | Payer: Medicare Other | Attending: Internal Medicine | Admitting: Internal Medicine

## 2016-04-18 DIAGNOSIS — T404X5A Adverse effect of other synthetic narcotics, initial encounter: Secondary | ICD-10-CM | POA: Insufficient documentation

## 2016-04-18 DIAGNOSIS — Z79899 Other long term (current) drug therapy: Secondary | ICD-10-CM | POA: Diagnosis not present

## 2016-04-18 DIAGNOSIS — G92 Toxic encephalopathy: Secondary | ICD-10-CM | POA: Diagnosis not present

## 2016-04-18 DIAGNOSIS — Z951 Presence of aortocoronary bypass graft: Secondary | ICD-10-CM | POA: Insufficient documentation

## 2016-04-18 DIAGNOSIS — I509 Heart failure, unspecified: Secondary | ICD-10-CM | POA: Insufficient documentation

## 2016-04-18 DIAGNOSIS — Z794 Long term (current) use of insulin: Secondary | ICD-10-CM | POA: Insufficient documentation

## 2016-04-18 DIAGNOSIS — Z23 Encounter for immunization: Secondary | ICD-10-CM | POA: Insufficient documentation

## 2016-04-18 DIAGNOSIS — R296 Repeated falls: Secondary | ICD-10-CM | POA: Insufficient documentation

## 2016-04-18 DIAGNOSIS — E11610 Type 2 diabetes mellitus with diabetic neuropathic arthropathy: Secondary | ICD-10-CM | POA: Diagnosis not present

## 2016-04-18 DIAGNOSIS — I110 Hypertensive heart disease with heart failure: Secondary | ICD-10-CM | POA: Insufficient documentation

## 2016-04-18 DIAGNOSIS — G894 Chronic pain syndrome: Secondary | ICD-10-CM | POA: Diagnosis not present

## 2016-04-18 DIAGNOSIS — R1310 Dysphagia, unspecified: Secondary | ICD-10-CM | POA: Diagnosis not present

## 2016-04-18 DIAGNOSIS — Z7982 Long term (current) use of aspirin: Secondary | ICD-10-CM | POA: Insufficient documentation

## 2016-04-18 DIAGNOSIS — E1140 Type 2 diabetes mellitus with diabetic neuropathy, unspecified: Secondary | ICD-10-CM | POA: Insufficient documentation

## 2016-04-18 DIAGNOSIS — E785 Hyperlipidemia, unspecified: Secondary | ICD-10-CM | POA: Diagnosis not present

## 2016-04-18 DIAGNOSIS — Z8673 Personal history of transient ischemic attack (TIA), and cerebral infarction without residual deficits: Secondary | ICD-10-CM | POA: Insufficient documentation

## 2016-04-18 DIAGNOSIS — L03012 Cellulitis of left finger: Secondary | ICD-10-CM

## 2016-04-18 DIAGNOSIS — T481X5A Adverse effect of skeletal muscle relaxants [neuromuscular blocking agents], initial encounter: Secondary | ICD-10-CM | POA: Insufficient documentation

## 2016-04-18 DIAGNOSIS — R410 Disorientation, unspecified: Secondary | ICD-10-CM | POA: Diagnosis not present

## 2016-04-18 DIAGNOSIS — T402X5A Adverse effect of other opioids, initial encounter: Secondary | ICD-10-CM | POA: Insufficient documentation

## 2016-04-18 DIAGNOSIS — Z7902 Long term (current) use of antithrombotics/antiplatelets: Secondary | ICD-10-CM | POA: Insufficient documentation

## 2016-04-18 DIAGNOSIS — I2510 Atherosclerotic heart disease of native coronary artery without angina pectoris: Secondary | ICD-10-CM | POA: Insufficient documentation

## 2016-04-18 DIAGNOSIS — M6281 Muscle weakness (generalized): Secondary | ICD-10-CM

## 2016-04-18 DIAGNOSIS — Z8249 Family history of ischemic heart disease and other diseases of the circulatory system: Secondary | ICD-10-CM | POA: Insufficient documentation

## 2016-04-18 DIAGNOSIS — Z87891 Personal history of nicotine dependence: Secondary | ICD-10-CM | POA: Insufficient documentation

## 2016-04-18 DIAGNOSIS — R4182 Altered mental status, unspecified: Secondary | ICD-10-CM

## 2016-04-18 LAB — CBC WITH DIFFERENTIAL/PLATELET
BASOS PCT: 1 %
Basophils Absolute: 0 10*3/uL (ref 0–0.1)
Eosinophils Absolute: 0 10*3/uL (ref 0–0.7)
Eosinophils Relative: 1 %
HEMATOCRIT: 47.8 % (ref 40.0–52.0)
HEMOGLOBIN: 16.8 g/dL (ref 13.0–18.0)
LYMPHS ABS: 1.5 10*3/uL (ref 1.0–3.6)
LYMPHS PCT: 32 %
MCH: 30.7 pg (ref 26.0–34.0)
MCHC: 35.1 g/dL (ref 32.0–36.0)
MCV: 87.5 fL (ref 80.0–100.0)
MONOS PCT: 13 %
Monocytes Absolute: 0.6 10*3/uL (ref 0.2–1.0)
NEUTROS ABS: 2.5 10*3/uL (ref 1.4–6.5)
NEUTROS PCT: 53 %
Platelets: 133 10*3/uL — ABNORMAL LOW (ref 150–440)
RBC: 5.46 MIL/uL (ref 4.40–5.90)
RDW: 14.4 % (ref 11.5–14.5)
WBC: 4.7 10*3/uL (ref 3.8–10.6)

## 2016-04-18 LAB — URINALYSIS COMPLETE WITH MICROSCOPIC (ARMC ONLY)
BACTERIA UA: NONE SEEN
Bilirubin Urine: NEGATIVE
Glucose, UA: >500 mg/dL — AB
HGB URINE DIPSTICK: NEGATIVE
Ketones, ur: NEGATIVE mg/dL
LEUKOCYTES UA: NEGATIVE
NITRITE: NEGATIVE
PH: 6 (ref 5.0–8.0)
PROTEIN: NEGATIVE mg/dL
SPECIFIC GRAVITY, URINE: 1.008 (ref 1.005–1.030)
Squamous Epithelial / LPF: NONE SEEN

## 2016-04-18 LAB — COMPREHENSIVE METABOLIC PANEL
ALBUMIN: 3.7 g/dL (ref 3.5–5.0)
ALK PHOS: 117 U/L (ref 38–126)
ALT: 22 U/L (ref 17–63)
ANION GAP: 9 (ref 5–15)
AST: 24 U/L (ref 15–41)
BILIRUBIN TOTAL: 0.8 mg/dL (ref 0.3–1.2)
BUN: 15 mg/dL (ref 6–20)
CALCIUM: 9.1 mg/dL (ref 8.9–10.3)
CO2: 28 mmol/L (ref 22–32)
Chloride: 99 mmol/L — ABNORMAL LOW (ref 101–111)
Creatinine, Ser: 1.18 mg/dL (ref 0.61–1.24)
GFR calc Af Amer: >60 mL/min (ref >60)
GFR calc non Af Amer: >60 mL/min (ref >60)
GLUCOSE: 195 mg/dL — AB (ref 65–99)
Potassium: 3.9 mmol/L (ref 3.5–5.1)
Sodium: 136 mmol/L (ref 135–145)
TOTAL PROTEIN: 7.5 g/dL (ref 6.5–8.1)

## 2016-04-18 LAB — URINE DRUG SCREEN, QUALITATIVE (ARMC ONLY)
Amphetamines, Ur Screen: NOT DETECTED
BARBITURATES, UR SCREEN: NOT DETECTED
BENZODIAZEPINE, UR SCRN: NOT DETECTED
CANNABINOID 50 NG, UR ~~LOC~~: NOT DETECTED
Cocaine Metabolite,Ur ~~LOC~~: NOT DETECTED
MDMA (Ecstasy)Ur Screen: NOT DETECTED
Methadone Scn, Ur: NOT DETECTED
Opiate, Ur Screen: NOT DETECTED
Phencyclidine (PCP) Ur S: NOT DETECTED
TRICYCLIC, UR SCREEN: POSITIVE — AB

## 2016-04-18 LAB — GLUCOSE, CAPILLARY
Glucose-Capillary: 95 mg/dL (ref 65–99)
Glucose-Capillary: 106 mg/dL — ABNORMAL HIGH (ref 65–99)
Glucose-Capillary: 112 mg/dL — ABNORMAL HIGH (ref 65–99)
Glucose-Capillary: 100 mg/dL — ABNORMAL HIGH (ref 65–99)

## 2016-04-18 LAB — TSH: TSH: 2.086 u[IU]/mL (ref 0.350–4.500)

## 2016-04-18 LAB — SALICYLATE LEVEL

## 2016-04-18 LAB — LACTIC ACID, PLASMA: Lactic Acid, Venous: 1.7 mmol/L (ref 0.5–1.9)

## 2016-04-18 LAB — TROPONIN I: Troponin I: <0.03 ng/mL (ref <0.03)

## 2016-04-18 LAB — ETHANOL: Alcohol, Ethyl (B): <5 mg/dL (ref <5)

## 2016-04-18 LAB — ACETAMINOPHEN LEVEL: Acetaminophen (Tylenol), Serum: <10 ug/mL — ABNORMAL LOW (ref 10–30)

## 2016-04-18 MED ORDER — PANTOPRAZOLE SODIUM 40 MG PO TBEC
40.0000 mg | DELAYED_RELEASE_TABLET | Freq: Every day | ORAL | Status: DC
  Administered 2016-04-18 – 2016-04-19 (×2): 40 mg via ORAL
  Filled 2016-04-18 (×2): qty 1

## 2016-04-18 MED ORDER — PIPERACILLIN-TAZOBACTAM 3.375 G IVPB
3.3750 g | Freq: Once | INTRAVENOUS | Status: DC
  Filled 2016-04-18: qty 50

## 2016-04-18 MED ORDER — HALOPERIDOL LACTATE 5 MG/ML IJ SOLN
INTRAMUSCULAR | Status: AC
  Filled 2016-04-18: qty 1

## 2016-04-18 MED ORDER — SULFAMETHOXAZOLE-TRIMETHOPRIM 800-160 MG PO TABS
1.0000 | ORAL_TABLET | Freq: Two times a day (BID) | ORAL | Status: DC
  Administered 2016-04-18 – 2016-04-19 (×2): 1 via ORAL
  Filled 2016-04-18 (×4): qty 1

## 2016-04-18 MED ORDER — ATORVASTATIN CALCIUM 80 MG PO TABS
80.0000 mg | ORAL_TABLET | Freq: Every day | ORAL | Status: DC
  Administered 2016-04-18: 10:00:00 80 mg via ORAL
  Filled 2016-04-18: qty 2

## 2016-04-18 MED ORDER — ROSUVASTATIN CALCIUM 10 MG PO TABS: 40.0000 mg | ORAL_TABLET | Freq: Every day | ORAL | Status: DC

## 2016-04-18 MED ORDER — HALOPERIDOL LACTATE 5 MG/ML IJ SOLN
1.0000 mg | INTRAMUSCULAR | Status: AC
  Administered 2016-04-18: 1 mg via INTRAVENOUS
  Filled 2016-04-18: qty 1

## 2016-04-18 MED ORDER — NIACIN ER 500 MG PO CPCR
500.0000 mg | ORAL_CAPSULE | Freq: Every day | ORAL | Status: DC
  Filled 2016-04-18 (×2): qty 1

## 2016-04-18 MED ORDER — HALOPERIDOL LACTATE 5 MG/ML IJ SOLN
5.0000 mg | Freq: Once | INTRAMUSCULAR | Status: AC
  Administered 2016-04-18: 5 mg via INTRAVENOUS

## 2016-04-18 MED ORDER — HALOPERIDOL LACTATE 5 MG/ML IJ SOLN
2.0000 mg | Freq: Four times a day (QID) | INTRAMUSCULAR | Status: DC | PRN
  Administered 2016-04-18 (×2): 2 mg via INTRAVENOUS
  Filled 2016-04-18 (×3): qty 1

## 2016-04-18 MED ORDER — DIPHENHYDRAMINE HCL 50 MG/ML IJ SOLN
25.0000 mg | Freq: Once | INTRAMUSCULAR | Status: AC
  Administered 2016-04-18: 25 mg via INTRAVENOUS
  Filled 2016-04-18: qty 1

## 2016-04-18 MED ORDER — METFORMIN HCL 500 MG PO TABS
500.0000 mg | ORAL_TABLET | Freq: Two times a day (BID) | ORAL | Status: DC
  Administered 2016-04-18: 10:00:00 500 mg via ORAL
  Filled 2016-04-18: qty 1

## 2016-04-18 MED ORDER — ZOLPIDEM TARTRATE 5 MG PO TABS: 5.0000 mg | ORAL_TABLET | Freq: Every evening | ORAL | Status: DC | PRN

## 2016-04-18 MED ORDER — LORAZEPAM 2 MG/ML IJ SOLN
1.0000 mg | Freq: Once | INTRAMUSCULAR | Status: AC
  Administered 2016-04-18: 1 mg via INTRAVENOUS

## 2016-04-18 MED ORDER — FUROSEMIDE 40 MG PO TABS
40.0000 mg | ORAL_TABLET | Freq: Two times a day (BID) | ORAL | Status: DC
  Administered 2016-04-18 – 2016-04-19 (×2): 40 mg via ORAL
  Filled 2016-04-18 (×2): qty 1

## 2016-04-18 MED ORDER — ASPIRIN EC 81 MG PO TBEC
81.0000 mg | DELAYED_RELEASE_TABLET | Freq: Every day | ORAL | Status: DC
  Administered 2016-04-18 – 2016-04-19 (×2): 81 mg via ORAL
  Filled 2016-04-18 (×2): qty 1

## 2016-04-18 MED ORDER — ENOXAPARIN SODIUM 40 MG/0.4ML ~~LOC~~ SOLN
40.0000 mg | SUBCUTANEOUS | Status: DC
  Administered 2016-04-18 – 2016-04-19 (×2): 40 mg via SUBCUTANEOUS
  Filled 2016-04-18 (×2): qty 0.4

## 2016-04-18 MED ORDER — INSULIN ASPART 100 UNIT/ML ~~LOC~~ SOLN: 0.0000 [IU] | Freq: Three times a day (TID) | SUBCUTANEOUS | Status: DC

## 2016-04-18 MED ORDER — GEMFIBROZIL 600 MG PO TABS
600.0000 mg | ORAL_TABLET | Freq: Two times a day (BID) | ORAL | Status: DC
Start: 2016-04-18 — End: 2016-04-19
  Administered 2016-04-18 – 2016-04-19 (×2): 600 mg via ORAL
  Filled 2016-04-18 (×3): qty 1

## 2016-04-18 MED ORDER — SODIUM CHLORIDE 0.9 % IV BOLUS (SEPSIS)
1000.0000 mL | Freq: Once | INTRAVENOUS | Status: AC
  Administered 2016-04-18: 1000 mL via INTRAVENOUS

## 2016-04-18 MED ORDER — CYCLOBENZAPRINE HCL 10 MG PO TABS
10.0000 mg | ORAL_TABLET | Freq: Two times a day (BID) | ORAL | Status: DC | PRN
  Administered 2016-04-18: 10 mg via ORAL
  Filled 2016-04-18: qty 1

## 2016-04-18 MED ORDER — ONDANSETRON HCL 4 MG/2ML IJ SOLN
4.0000 mg | Freq: Four times a day (QID) | INTRAMUSCULAR | Status: DC | PRN
  Administered 2016-04-18 – 2016-04-19 (×2): 4 mg via INTRAVENOUS
  Filled 2016-04-18 (×2): qty 2

## 2016-04-18 MED ORDER — MORPHINE SULFATE (PF) 4 MG/ML IV SOLN
2.0000 mg | Freq: Once | INTRAVENOUS | Status: AC
  Administered 2016-04-18: 2 mg via INTRAVENOUS
  Filled 2016-04-18: qty 1

## 2016-04-18 MED ORDER — ACETAMINOPHEN 325 MG PO TABS: 650.0000 mg | ORAL_TABLET | Freq: Four times a day (QID) | ORAL | Status: DC | PRN

## 2016-04-18 MED ORDER — CARVEDILOL 3.125 MG PO TABS
3.1250 mg | ORAL_TABLET | Freq: Two times a day (BID) | ORAL | Status: DC
  Administered 2016-04-18 – 2016-04-19 (×2): 3.125 mg via ORAL
  Filled 2016-04-18 (×3): qty 1

## 2016-04-18 MED ORDER — POTASSIUM CHLORIDE CRYS ER 20 MEQ PO TBCR
20.0000 meq | EXTENDED_RELEASE_TABLET | Freq: Every day | ORAL | Status: DC
  Administered 2016-04-18 – 2016-04-19 (×2): 20 meq via ORAL
  Filled 2016-04-18 (×2): qty 1

## 2016-04-18 MED ORDER — VANCOMYCIN HCL IN DEXTROSE 1-5 GM/200ML-% IV SOLN
1000.0000 mg | Freq: Once | INTRAVENOUS | Status: AC
  Administered 2016-04-18: 1000 mg via INTRAVENOUS
  Filled 2016-04-18: qty 200

## 2016-04-18 MED ORDER — DIPHENHYDRAMINE HCL 50 MG/ML IJ SOLN
12.5000 mg | Freq: Once | INTRAMUSCULAR | Status: AC
  Administered 2016-04-18: 12.5 mg via INTRAVENOUS
  Filled 2016-04-18: qty 1

## 2016-04-18 MED ORDER — IBUPROFEN 800 MG PO TABS
800.0000 mg | ORAL_TABLET | Freq: Three times a day (TID) | ORAL | Status: DC | PRN
  Administered 2016-04-18: 800 mg via ORAL
  Filled 2016-04-18 (×3): qty 1

## 2016-04-18 MED ORDER — DOCUSATE SODIUM 100 MG PO CAPS
100.0000 mg | ORAL_CAPSULE | Freq: Two times a day (BID) | ORAL | Status: DC
  Administered 2016-04-18 – 2016-04-19 (×2): 100 mg via ORAL
  Filled 2016-04-18 (×3): qty 1

## 2016-04-18 MED ORDER — LORAZEPAM 2 MG/ML IJ SOLN
1.0000 mg | Freq: Once | INTRAMUSCULAR | Status: AC
  Administered 2016-04-18: 1 mg via INTRAVENOUS
  Filled 2016-04-18: qty 1

## 2016-04-18 MED ORDER — HALOPERIDOL LACTATE 5 MG/ML IJ SOLN: 1.0000 mg | Freq: Four times a day (QID) | INTRAMUSCULAR | Status: DC | PRN

## 2016-04-18 MED ORDER — PREGABALIN 50 MG PO CAPS
100.0000 mg | ORAL_CAPSULE | Freq: Three times a day (TID) | ORAL | Status: DC
Start: 2016-04-18 — End: 2016-04-19
  Administered 2016-04-18 – 2016-04-19 (×2): 100 mg via ORAL
  Filled 2016-04-18 (×3): qty 2

## 2016-04-18 MED ORDER — COLCHICINE 0.6 MG PO TABS
0.6000 mg | ORAL_TABLET | Freq: Two times a day (BID) | ORAL | Status: DC
Start: 2016-04-18 — End: 2016-04-19
  Administered 2016-04-18 – 2016-04-19 (×2): 0.6 mg via ORAL
  Filled 2016-04-18 (×4): qty 1

## 2016-04-18 MED ORDER — SODIUM CHLORIDE 0.9 % IV SOLN
INTRAVENOUS | Status: AC
  Administered 2016-04-18: 22:00:00 via INTRAVENOUS

## 2016-04-18 MED ORDER — CLOPIDOGREL BISULFATE 75 MG PO TABS
75.0000 mg | ORAL_TABLET | Freq: Every day | ORAL | Status: DC
  Administered 2016-04-18 – 2016-04-19 (×2): 75 mg via ORAL
  Filled 2016-04-18 (×2): qty 1

## 2016-04-18 MED ORDER — ONDANSETRON HCL 4 MG PO TABS
4.0000 mg | ORAL_TABLET | Freq: Four times a day (QID) | ORAL | Status: DC | PRN
Start: 2016-04-18 — End: 2016-04-19

## 2016-04-18 MED ORDER — AMITRIPTYLINE HCL 50 MG PO TABS
150.0000 mg | ORAL_TABLET | Freq: Every day | ORAL | Status: DC
  Administered 2016-04-18 – 2016-04-19 (×2): 150 mg via ORAL
  Filled 2016-04-18 (×2): qty 3

## 2016-04-18 MED ORDER — LORAZEPAM 2 MG/ML IJ SOLN
INTRAMUSCULAR | Status: AC
  Filled 2016-04-18: qty 1

## 2016-04-18 MED ORDER — LISINOPRIL 5 MG PO TABS
5.0000 mg | ORAL_TABLET | Freq: Every day | ORAL | Status: DC
  Administered 2016-04-18 – 2016-04-19 (×2): 5 mg via ORAL
  Filled 2016-04-18 (×2): qty 1

## 2016-04-18 MED ORDER — INFLUENZA VAC SPLIT QUAD 0.5 ML IM SUSY
0.5000 mL | PREFILLED_SYRINGE | INTRAMUSCULAR | Status: AC
  Administered 2016-04-19: 11:00:00 0.5 mL via INTRAMUSCULAR
  Filled 2016-04-18: qty 0.5

## 2016-04-18 MED ORDER — PIPERACILLIN-TAZOBACTAM 3.375 G IVPB 30 MIN
3.3750 g | Freq: Once | INTRAVENOUS | Status: AC
  Administered 2016-04-18: 3.375 g via INTRAVENOUS

## 2016-04-18 MED ORDER — INSULIN GLARGINE 100 UNIT/ML ~~LOC~~ SOLN
40.0000 [IU] | Freq: Every day | SUBCUTANEOUS | Status: DC
Start: 2016-04-18 — End: 2016-04-19
  Filled 2016-04-18 (×2): qty 0.4

## 2016-04-18 MED ORDER — B COMPLEX-C PO TABS
1.0000 | ORAL_TABLET | Freq: Every day | ORAL | Status: DC
  Administered 2016-04-18: 1 via ORAL
  Filled 2016-04-18 (×2): qty 1

## 2016-04-18 MED ORDER — ACETAMINOPHEN 650 MG RE SUPP: 650.0000 mg | Freq: Four times a day (QID) | RECTAL | Status: DC | PRN

## 2016-04-18 NOTE — ED Provider Notes (Signed)
[facility] Emergency Department Provider Note   ____________________________________________   First MD Initiated Contact with Patient 04/18/16 [phone]     (approximate)  I have reviewed the triage vital signs and the nursing notes.   HISTORY  Chief Complaint Altered Mental Status  Limited secondary to altered mentation  HPI Rosina LowensteinBoyd D Coupland is a 65 y.o. male brought to the ED from home via EMS with a chief complaint of altered mental status. Patient has a history of Charcot Hilda LiasMarie tooth muscular atrophy, CHF, diabetes, hypertension, stroke. Family reports multiple falls over the past several days. Patient is on several pain medications which family states he dispenses himself. EMS found fentanyl patch; patient was given 2 mg intranasal Narcan with good response. Rest of history unobtainable secondary to patient's confusion.   Past Medical History:  Diagnosis Date  . 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 CHF (congestive heart failure) (HCC)   . Diabetes mellitus without complication (HCC)   . Glaucoma   . Hypertension   . Stroke [facility](HCC)     Patient Active Problem List   Diagnosis Date Noted  . Cellulitis 12/20/2015    Past Surgical History:  Procedure Laterality Date  . CARDIAC SURGERY    . CORONARY ARTERY BYPASS GRAFT    . EYE SURGERY    . INCISION AND DRAINAGE WOUND WITH TENDON REPAIR Left 12/27/2015   Procedure: INCISION AND DRAINAGE WOUND WITH TENDON REPAIR;  Surgeon: Kennedy BuckerMichael Menz, MD;  Location: [facility] ORS;  Service: Orthopedics;  Laterality: Left;  . Right foot surgery    . Right knee arthroscopy    . TOE AMPUTATION      Prior to Admission medications   Medication Sig Start Date End Date Taking? Authorizing Provider  amitriptyline (ELAVIL) 150 MG tablet Take 150 mg by mouth daily.    Historical Provider, MD  aspirin 81 MG tablet Take 81 mg by mouth daily. 05/03/04   Historical Provider, MD  atorvastatin (LIPITOR) 80 MG tablet Take 80 mg by  mouth daily. 12/11/15   Historical Provider, MD  carvedilol (COREG) 3.125 MG tablet Take 3.125 mg by mouth 2 (two) times daily. 12/11/15   Historical Provider, MD  clopidogrel (PLAVIX) 75 MG tablet Take 75 mg by mouth daily. 08/08/15 08/07/16  Historical Provider, MD  colchicine 0.6 MG tablet Take 0.6 mg by mouth 2 (two) times daily. 11/08/15   Historical Provider, MD  cyclobenzaprine (FLEXERIL) 10 MG tablet Take 10 mg by mouth 2 (two) times daily as needed. 11/26/15   Historical Provider, MD  esomeprazole (NEXIUM) 40 MG capsule Take 40 mg by mouth daily. 11/08/13   Historical Provider, MD  furosemide (LASIX) 40 MG tablet Take 40 mg by mouth 2 (two) times daily. 09/23/15   Historical Provider, MD  gemfibrozil (LOPID) 600 MG tablet Take 600 mg by mouth 2 (two) times daily. 11/03/13   Historical Provider, MD  glipiZIDE (GLUCOTROL XL) 10 MG 24 hr tablet Take 20 mg by mouth daily. 11/08/13   Historical Provider, MD  HYDROcodone-acetaminophen (NORCO) 5-325 MG tablet Take 1 tablet by mouth every 6 (six) hours as needed for moderate pain. 12/27/15   Kennedy BuckerMichael Menz, MD  insulin glargine (LANTUS) 100 UNIT/ML injection Inject 50 Units into the skin at bedtime. 01/12/14   Historical Provider, MD  lisinopril (PRINIVIL,ZESTRIL) 5 MG tablet Take 5 mg by mouth daily.    Historical Provider, MD  metFORMIN (GLUCOPHAGE) 500 MG tablet Take 500 mg by mouth 2 (two) times daily with  a meal.    Historical Provider, MD  potassium chloride SA (K-DUR,KLOR-CON) 20 MEQ tablet Take 20 mEq by mouth daily.    Historical Provider, MD  pregabalin (LYRICA) 100 MG capsule Take 100 mg by mouth 3 (three) times daily. 05/02/13   Historical Provider, MD  sulfamethoxazole-trimethoprim (BACTRIM DS,SEPTRA DS) 800-160 MG tablet Take 1 tablet by mouth 2 (two) times daily. 12/22/15   Adrian Saran, MD    Allergies Penicillins  Family History  Problem Relation Age of Onset  . CAD Mother   . Diabetes Mother   . CAD Father   . Diabetes Father     Social  History Social History  Substance Use Topics  . Smoking status: Former Games developer  . Smokeless tobacco: Never Used  . Alcohol use No    Review of Systems  Constitutional: No fever/chills. Eyes: No visual changes. ENT: No sore throat. Cardiovascular: Denies chest pain. Respiratory: Denies shortness of breath. Gastrointestinal: No abdominal pain.  No nausea, no vomiting.  No diarrhea.  No constipation. Genitourinary: Negative for dysuria. Musculoskeletal: Negative for back pain. Skin: Negative for rash. Neurological: Positive for altered mentation. Negative for headaches, focal weakness or numbness.  10-point ROS otherwise negative.  ____________________________________________   PHYSICAL EXAM:  VITAL SIGNS: ED Triage Vitals  Enc Vitals Group     BP 04/18/16 0053 (!) 157/100     Pulse Rate 04/18/16 0053 99     Resp 04/18/16 0053 (!) 24     Temp 04/18/16 0053 98.1 F (36.7 C)     Temp Source 04/18/16 0053 Oral     SpO2 04/18/16 0053 96 %     Weight 04/18/16 0053 210 lb (95.3 kg)     Height 04/18/16 0053 6\' 1"  (1.854 m)     Head Circumference --      Peak Flow --      Pain Score 04/18/16 0054 9     Pain Loc --      Pain Edu? --      Excl. in GC? --     Constitutional: Alert and confused. Disheveled appearing and in mild acute distress. Eyes: Conjunctivae are normal. PERRL. EOMI. Head: Atraumatic. Nose: No congestion/rhinnorhea. Mouth/Throat: Mucous membranes are moist.  Oropharynx non-erythematous. Neck: No stridor.  Supple neck without meningismus. Hematological/Lymphatic/Immunilogical: No cervical lymphadenopathy. Cardiovascular: Normal rate, regular rhythm. Grossly normal heart sounds.  Good peripheral circulation. Respiratory: Normal respiratory effort.  No retractions. Lungs CTAB. Gastrointestinal: Soft and nontender. No distention. No abdominal bruits. No CVA tenderness. Midline ventral hernia. Musculoskeletal: Right shin bandaged from prior fall. Left third  digit remains in flexed position; patient appears to be unable to extend the digit. There is an abrasion with surrounding erythema and warmth to the dorsal PIP.  Neurologic:  Jarbled speech and language. No gross focal neurologic deficits are appreciated. MAEx4. Skin:  Skin is warm, dry and intact. No rash noted. No petechiae. Psychiatric: Mood and affect are agitated. Speech and behavior are normal.  ____________________________________________   LABS (all labs ordered are listed, but only abnormal results are displayed)  Labs Reviewed  CBC WITH DIFFERENTIAL/PLATELET - Abnormal; Notable for the following:       Result Value   Platelets 133 (*)    All other components within normal limits  COMPREHENSIVE METABOLIC PANEL - Abnormal; Notable for the following:    Chloride 99 (*)    Glucose, Bld 195 (*)    All other components within normal limits  ACETAMINOPHEN LEVEL - Abnormal; Notable for the  following:    Acetaminophen (Tylenol), Serum <10 (*)    All other components within normal limits  URINALYSIS COMPLETEWITH MICROSCOPIC ([facility] ONLY) - Abnormal; Notable for the following:    Color, Urine YELLOW (*)    APPearance CLOUDY (*)    Glucose, UA >500 (*)    All other components within normal limits  URINE DRUG SCREEN, QUALITATIVE ([facility] ONLY) - Abnormal; Notable for the following:    Tricyclic, Ur Screen POSITIVE (*)    All other components within normal limits  CULTURE, BLOOD (ROUTINE X 2)  CULTURE, BLOOD (ROUTINE X 2)  URINE CULTURE  ETHANOL  SALICYLATE LEVEL  TROPONIN I  LACTIC ACID, PLASMA  LACTIC ACID, PLASMA   ____________________________________________  EKG  ED ECG REPORT I, Kourtlynn Trevor J, the attending physician, personally viewed and interpreted this ECG.   Date: 04/18/2016  EKG Time: 0101  Rate: 113  Rhythm: sinus tachycardia  Axis: Normal  Intervals:nonspecific intraventricular conduction delay  ST&T Change:  Nonspecific  ____________________________________________  RADIOLOGY  Portable chest x-ray (viewed by me, interpreted per Dr. Jake Samples): Low lung volumes with linear atelectasis or scar at the left base.  CT head without contrast interpreted per Dr. Chase Picket: 1. Examination markedly degraded by motion.  2. Within that limitation, normal head CT for age.   ____________________________________________   PROCEDURES  Procedure(s) performed: None  Procedures  Critical Care performed: Yes, see critical care note(s)   CRITICAL CARE Performed by: Irean Hong   Total critical care time: 30 minutes  Critical care time was exclusive of separately billable procedures and treating other patients.  Critical care was necessary to treat or prevent imminent or life-threatening deterioration.  Critical care was time spent personally by me on the following activities: development of treatment plan with patient and/or surrogate as well as nursing, discussions with consultants, evaluation of patient's response to treatment, examination of patient, obtaining history from patient or surrogate, ordering and performing treatments and interventions, ordering and review of laboratory studies, ordering and review of radiographic studies, pulse oximetry and re-evaluation of patient's condition.  ____________________________________________   INITIAL IMPRESSION / ASSESSMENT AND PLAN / ED COURSE  Pertinent labs & imaging results that were available during my care of the patient were reviewed by me and considered in my medical decision making (see chart for details).  65 year old male who presents with altered mentation. Family reported to EMS that this has happened previously secondary to patient's medications. Will obtain screening toxicological blood work, obtain CT head, urinalysis and reassess.  Clinical Course as of Apr 18 358  Thu Apr 18, 2016  0230 Patient agitated, trying to get out of bed.  Has had Haldol and Ativan. He is more calm but still restless and moving. Will administer IV morphine.  [JS]  54 Wife and daughter at bedside. States patient manages his own medications so they are unsure whether or not he took extra or has been out of his opiate pain medications. Spouse states patient cut the tendon to his left third digit and is supposed to have surgery in the next few weeks. States he struck the finger last week and the skin looks redder tonight than it has previously.  [JS]  [id number] Updated family of negative CT results. Will empirically place patient on vancomycin and Zosyn for left finger cellulitis.  [JS]    Clinical Course User Index [JS] Irean Hong, MD     ____________________________________________   FINAL CLINICAL IMPRESSION(S) / ED DIAGNOSES  Final diagnoses:  Altered mental status,  unspecified altered mental status type  Confusion  Cellulitis of finger of left hand      NEW MEDICATIONS STARTED DURING THIS VISIT:  New Prescriptions   No medications on file     Note:  This document was prepared using Dragon voice recognition software and may include unintentional dictation errors.    Irean HongJade J Deshawna Mcneece, MD 04/18/16 [id number]

## 2016-04-18 NOTE — Progress Notes (Signed)
Admitted this morning for altered mental status., Patient had fallen 5 times yesterday. EMS removed and a little patch and gave him Narcan which she made him more alert but still confused, patient says that he wants to go home today. According to  the note is not eating well for the past few months. He required Ativan for agitation in the emergency room. Labs, medications are reviewed.  Exline #1 altered mental status likely worsening dementia: Psychiatric consult requested for further disposition. Also check the TSH, ceruloplasmin, RPR  3.charcots joint;  #4 diabetes mellitus type 2  Hyperlipidemia  Cellulitis of the left leg #7. as essential hypertension: Blood pressure is little bit high because of agitation.

## 2016-04-18 NOTE — ED Notes (Signed)
Pt remains very restless with sitter at bedside for safety; wife and daughter at bedside; pt given morphine as ordered for c/o back pain; wife says pt fell 5 times Wednesday; has fallen a few times over the last 3 days, but not as much as Wednesday; she says tonight she and her grandson had too much trouble getting pt up to the BR to void, and he was confused and hallucinating; this is what concerned her and she called 911; pt with scrape to left 3rd digit; pt unable to move same finger; wife says pt cut the tendon and is to have surgery around Christmas; scrape occurred about a week ago; finger is red and swollen; pt also with wounds to shins that wife said have occurred during his many falls; areas scabbed, without redness/swelling;

## 2016-04-18 NOTE — Progress Notes (Signed)
PT Cancellation Note  Patient Details Name: Mark Armstrong MRN: [id number] DOB: 04-14-1951   Cancelled Treatment:    Reason Eval/Treat Not Completed: Other (comment) Spoke with RN, Dedra, who states pt continues to be agitated and restless and now would not be a good time to assess patient.  Will continue to follow.  Doloris Servantes A [name], PT 04/18/2016, 2:00 PM

## 2016-04-18 NOTE — ED Notes (Signed)
Pt still restless in CT; Ativan 1mg  IV given as ordered and allowed by MD;

## 2016-04-18 NOTE — ED Notes (Signed)
Pt still too restless for CT head scan; medication given as prescribed by MD; sitter remains at bedside for safety reasons;

## 2016-04-18 NOTE — ED Notes (Signed)
Pt unable to remain completely still for CT scan; MD aware; MD in to room to speak with pt's wife and daughter; pt incontinent of urine; ED tech at bedside to clean pt

## 2016-04-18 NOTE — H&P (Signed)
Mark Armstrong is an 65 y.o. male.   Chief Complaint: Altered mental status HPI: The patient with past medical history of diabetes and coronary artery disease status post bypass presents emergency department with confusion and agitation. The patient's wife and daughter states that he has rapidly become incoherent over the last 36 hours. In the week preceding admission he has become more unstable and has had 5 falls yesterday. The patient is unable to contribute to his history as he is agitated and hallucinating. Family state that this is happened once before but that the etiology of that episode was never found. EMS administered intranasal Narcan and removed the patient's fentanyl patch which made him more alert but no more clear. CT of the head was unremarkable, but the patient received multiple doses of Haldol, Benadryl, morphine and Ativan for sedation. At the time of my exam all of these medicines had worn off. The patient was not violent but very agitated which prompted the emergency department staff to call for admission.  Past Medical History:  Diagnosis Date  . 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 CHF (congestive heart failure) ([place])   . Diabetes mellitus without complication ([place])   . Glaucoma   . Hypertension   . Stroke [facility])     Past Surgical History:  Procedure Laterality Date  . CARDIAC SURGERY    . CORONARY ARTERY BYPASS GRAFT    . EYE SURGERY    . INCISION AND DRAINAGE WOUND WITH TENDON REPAIR Left 12/27/2015   Procedure: INCISION AND DRAINAGE WOUND WITH TENDON REPAIR;  Surgeon: Hessie Knows, MD;  Location: [facility] ORS;  Service: Orthopedics;  Laterality: Left;  . Right foot surgery    . Right knee arthroscopy    . TOE AMPUTATION      Family History  Problem Relation Age of Onset  . CAD Mother   . Diabetes Mother   . CAD Father   . Diabetes Father    Social History:  reports that he has quit smoking. He has never used smokeless tobacco. He reports that he does not drink  alcohol or use drugs.  Allergies:  Allergies  Allergen Reactions  . Penicillins Rash    Prior to Admission medications   Medication Sig Start Date End Date Taking? Authorizing Provider  amitriptyline (ELAVIL) 150 MG tablet Take 150 mg by mouth daily.   Yes Historical Provider, MD  aspirin 81 MG tablet Take 81 mg by mouth daily. 05/03/04  Yes Historical Provider, MD  carvedilol (COREG) 12.5 MG tablet Take 3.125 mg by mouth 2 (two) times daily. 12/11/15  Yes Historical Provider, MD  colchicine 0.6 MG tablet Take 0.6 mg by mouth 2 (two) times daily. 11/08/15  Yes Historical Provider, MD  cyclobenzaprine (FLEXERIL) 10 MG tablet Take 10 mg by mouth 2 (two) times daily as needed. 11/26/15  Yes Historical Provider, MD  fentaNYL (DURAGESIC - DOSED MCG/HR) 25 MCG/HR patch Place 1 patch onto the skin every 3 (three) days. 02/19/16  Yes Historical Provider, MD  furosemide (LASIX) 40 MG tablet Take 40 mg by mouth 2 (two) times daily. 09/23/15  Yes Historical Provider, MD  insulin glargine (LANTUS) 100 UNIT/ML injection Inject 55 Units into the skin at bedtime.  01/12/14  Yes Historical Provider, MD  lisinopril (PRINIVIL,ZESTRIL) 5 MG tablet Take 5 mg by mouth daily.   Yes Historical Provider, MD  oxyCODONE-acetaminophen (PERCOCET) 10-325 MG tablet Take 1 tablet by mouth every 6 (six) hours as needed for pain.   Yes  Historical Provider, MD  pregabalin (LYRICA) 100 MG capsule Take 100 mg by mouth 3 (three) times daily. 05/02/13  Yes Historical Provider, MD  zolpidem (AMBIEN) 5 MG tablet Take 5 mg by mouth at bedtime as needed for sleep.   Yes Historical Provider, MD  atorvastatin (LIPITOR) 80 MG tablet Take 80 mg by mouth daily. 12/11/15   Historical Provider, MD  clopidogrel (PLAVIX) 75 MG tablet Take 75 mg by mouth daily. 08/08/15 08/07/16  Historical Provider, MD  esomeprazole (NEXIUM) 40 MG capsule Take 40 mg by mouth daily. 11/08/13   Historical Provider, MD  gemfibrozil (LOPID) 600 MG tablet Take 600 mg by  mouth 2 (two) times daily. 11/03/13   Historical Provider, MD  glipiZIDE (GLUCOTROL XL) 10 MG 24 hr tablet Take 20 mg by mouth daily. 11/08/13   Historical Provider, MD  HYDROcodone-acetaminophen (NORCO) 5-325 MG tablet Take 1 tablet by mouth every 6 (six) hours as needed for moderate pain. Patient not taking: Reported on 04/18/2016 12/27/15   Hessie Knows, MD  metFORMIN (GLUCOPHAGE) 500 MG tablet Take 500 mg by mouth 2 (two) times daily with a meal.    Historical Provider, MD  potassium chloride SA (K-DUR,KLOR-CON) 20 MEQ tablet Take 20 mEq by mouth daily.    Historical Provider, MD  sulfamethoxazole-trimethoprim (BACTRIM DS,SEPTRA DS) 800-160 MG tablet Take 1 tablet by mouth 2 (two) times daily. Patient not taking: Reported on 04/18/2016 12/22/15   Bettey Costa, MD     Results for orders placed or performed during the hospital encounter of 04/18/16 (from the past 48 hour(s))  Lactic acid, plasma     Status: None   Collection Time: 04/18/16 12:52 AM  Result Value Ref Range   Lactic Acid, Venous 1.7 0.5 - 1.9 mmol/L  CBC with Differential     Status: Abnormal   Collection Time: 04/18/16 12:54 AM  Result Value Ref Range   WBC 4.7 3.8 - 10.6 K/uL   RBC 5.46 4.40 - 5.90 MIL/uL   Hemoglobin 16.8 13.0 - 18.0 g/dL   HCT 47.8 40.0 - 52.0 %   MCV 87.5 80.0 - 100.0 fL   MCH 30.7 26.0 - 34.0 pg   MCHC 35.1 32.0 - 36.0 g/dL   RDW 14.4 11.5 - 14.5 %   Platelets 133 (L) 150 - 440 K/uL   Neutrophils Relative % 53 %   Neutro Abs 2.5 1.4 - 6.5 K/uL   Lymphocytes Relative 32 %   Lymphs Abs 1.5 1.0 - 3.6 K/uL   Monocytes Relative 13 %   Monocytes Absolute 0.6 0.2 - 1.0 K/uL   Eosinophils Relative 1 %   Eosinophils Absolute 0.0 0 - 0.7 K/uL   Basophils Relative 1 %   Basophils Absolute 0.0 0 - 0.1 K/uL  Comprehensive metabolic panel     Status: Abnormal   Collection Time: 04/18/16 12:54 AM  Result Value Ref Range   Sodium 136 135 - 145 mmol/L   Potassium 3.9 3.5 - 5.1 mmol/L   Chloride 99 (L) 101 -  111 mmol/L   CO2 28 22 - 32 mmol/L   Glucose, Bld 195 (H) 65 - 99 mg/dL   BUN 15 6 - 20 mg/dL   Creatinine, Ser 1.18 0.61 - 1.24 mg/dL   Calcium 9.1 8.9 - 10.3 mg/dL   Total Protein 7.5 6.5 - 8.1 g/dL   Albumin 3.7 3.5 - 5.0 g/dL   AST 24 15 - 41 U/L   ALT 22 17 - 63 U/L   Alkaline  Phosphatase 117 38 - 126 U/L   Total Bilirubin 0.8 0.3 - 1.2 mg/dL   GFR calc non Af Amer >60 >60 mL/min   GFR calc Af Amer >60 >60 mL/min    Comment: (NOTE) The eGFR has been calculated using the CKD EPI equation. This calculation has not been validated in all clinical situations. eGFR's persistently <60 mL/min signify possible Chronic Kidney Disease.    Anion gap 9 5 - 15  Ethanol     Status: None   Collection Time: 04/18/16 12:54 AM  Result Value Ref Range   Alcohol, Ethyl (B) <5 <5 mg/dL    Comment:        LOWEST DETECTABLE LIMIT FOR SERUM ALCOHOL IS 5 mg/dL FOR MEDICAL PURPOSES ONLY   Acetaminophen level     Status: Abnormal   Collection Time: 04/18/16 12:54 AM  Result Value Ref Range   Acetaminophen (Tylenol), Serum <10 (L) 10 - 30 ug/mL    Comment:        THERAPEUTIC CONCENTRATIONS VARY SIGNIFICANTLY. A RANGE OF 10-30 ug/mL MAY BE AN EFFECTIVE CONCENTRATION FOR MANY PATIENTS. HOWEVER, SOME ARE BEST TREATED AT CONCENTRATIONS OUTSIDE THIS RANGE. ACETAMINOPHEN CONCENTRATIONS >150 ug/mL AT 4 HOURS AFTER INGESTION AND >50 ug/mL AT 12 HOURS AFTER INGESTION ARE OFTEN ASSOCIATED WITH TOXIC REACTIONS.   Salicylate level     Status: None   Collection Time: 04/18/16 12:54 AM  Result Value Ref Range   Salicylate Lvl <1.6 2.8 - 30.0 mg/dL  Troponin I     Status: None   Collection Time: 04/18/16 12:54 AM  Result Value Ref Range   Troponin I <0.03 <0.03 ng/mL  Urinalysis complete, with microscopic ([facility] only)     Status: Abnormal   Collection Time: 04/18/16  1:17 AM  Result Value Ref Range   Color, Urine YELLOW (A) YELLOW   APPearance CLOUDY (A) CLEAR   Glucose, UA >500 (A) NEGATIVE  mg/dL   Bilirubin Urine NEGATIVE NEGATIVE   Ketones, ur NEGATIVE NEGATIVE mg/dL   Specific Gravity, Urine 1.008 1.005 - 1.030   Hgb urine dipstick NEGATIVE NEGATIVE   pH 6.0 5.0 - 8.0   Protein, ur NEGATIVE NEGATIVE mg/dL   Nitrite NEGATIVE NEGATIVE   Leukocytes, UA NEGATIVE NEGATIVE   RBC / HPF 0-5 0 - 5 RBC/hpf   WBC, UA 0-5 0 - 5 WBC/hpf   Bacteria, UA NONE SEEN NONE SEEN   Squamous Epithelial / LPF NONE SEEN NONE SEEN   Mucous PRESENT   Urine Drug Screen, Qualitative ([facility] only)     Status: Abnormal   Collection Time: 04/18/16  1:17 AM  Result Value Ref Range   Tricyclic, Ur Screen POSITIVE (A) NONE DETECTED   Amphetamines, Ur Screen NONE DETECTED NONE DETECTED   MDMA (Ecstasy)Ur Screen NONE DETECTED NONE DETECTED   Cocaine Metabolite,Ur [place] NONE DETECTED NONE DETECTED   Opiate, Ur Screen NONE DETECTED NONE DETECTED   Phencyclidine (PCP) Ur S NONE DETECTED NONE DETECTED   Cannabinoid 50 Ng, Ur [place] NONE DETECTED NONE DETECTED   Barbiturates, Ur Screen NONE DETECTED NONE DETECTED   Benzodiazepine, Ur Scrn NONE DETECTED NONE DETECTED   Methadone Scn, Ur NONE DETECTED NONE DETECTED    Comment: (NOTE) 109  Tricyclics, urine               Cutoff 1000 ng/mL 200  Amphetamines, urine             Cutoff 1000 ng/mL 300  MDMA (Ecstasy), urine  Cutoff 500 ng/mL 400  Cocaine Metabolite, urine       Cutoff 300 ng/mL 500  Opiate, urine                   Cutoff 300 ng/mL 600  Phencyclidine (PCP), urine      Cutoff 25 ng/mL 700  Cannabinoid, urine              Cutoff 50 ng/mL 800  Barbiturates, urine             Cutoff 200 ng/mL 900  Benzodiazepine, urine           Cutoff 200 ng/mL 1000 Methadone, urine                Cutoff 300 ng/mL 1100 1200 The urine drug screen provides only a preliminary, unconfirmed 1300 analytical test result and should not be used for non-medical 1400 purposes. Clinical consideration and professional judgment should 1500 be applied to any positive drug  screen result due to possible 1600 interfering substances. A more specific alternate chemical method 1700 must be used in order to obtain a confirmed analytical result.  1800 Gas chromato graphy / mass spectrometry (GC/MS) is the preferred 1900 confirmatory method.    Ct Head Wo Contrast  Result Date: 04/18/2016 CLINICAL DATA:  Altered mental status. EXAM: CT HEAD WITHOUT CONTRAST TECHNIQUE: Contiguous axial images were obtained from the base of the skull through the vertex without intravenous contrast. COMPARISON:  Head CT 03/10/2015 FINDINGS: Despite efforts by the technologist and patient, motion artifact is present on today's examination and could not be eliminated. This reduces the sensitivity and specificity of the study. Brain: No mass lesion, intraparenchymal hemorrhage or extra-axial collection. No evidence of acute cortical infarct. Brain parenchyma and CSF-containing spaces are normal for age. Vascular: No hyperdense vessel or unexpected calcification. Skull: Normal visualized skull base, calvarium and extracranial soft tissues. Sinuses/Orbits: No sinus fluid levels or advanced mucosal thickening. No mastoid effusion. Normal orbits. IMPRESSION: 1. Examination markedly degraded by motion. 2. Within that limitation, normal head CT for age. Electronically Signed   By: Ulyses Jarred M.D.   On: 04/18/2016 03:35   Dg Chest Port 1 View  Result Date: 04/18/2016 CLINICAL DATA:  Confusion EXAM: PORTABLE CHEST 1 VIEW COMPARISON:  01/07/2012 FINDINGS: Single-view chest demonstrates median sternotomy changes. There are low lung volumes. Linear atelectasis or scar at the left base. No acute consolidation or effusion. Stable cardiomediastinal silhouette. No pneumothorax. Left carotid artery calcification. IMPRESSION: Low lung volumes with linear atelectasis or scar at the left base. Electronically Signed   By: Donavan Foil M.D.   On: 04/18/2016 01:53    Review of Systems  Unable to perform ROS:  Mental status change  Musculoskeletal: Positive for falls.    Blood pressure (!) 156/111, pulse (!) 102, temperature 98.4 F (36.9 C), temperature source Rectal, resp. rate (!) 28, height _0  (1.854 m), weight 95.3 kg (210 lb), SpO2 96 %. Physical Exam  Vitals reviewed. Constitutional: He is oriented to person, place, and time. He appears well-developed and well-nourished. No distress.  HENT:  Head: Normocephalic and atraumatic.  Mouth/Throat: Oropharynx is clear and moist.  Pupils equal but not very reactive; also the patient barely tracks light source  Eyes: Conjunctivae and EOM are normal. No scleral icterus.  Neck: Normal range of motion. Neck supple. No JVD present. No tracheal deviation present. No thyromegaly present.  Cardiovascular: Normal rate, regular rhythm and normal heart sounds.  Exam reveals no  gallop and no friction rub.   No murmur heard. Respiratory: Effort normal and breath sounds normal. No respiratory distress.  GI: Soft. Bowel sounds are normal. He exhibits no distension. There is no tenderness.  Genitourinary:  Genitourinary Comments: Deferred  Musculoskeletal: Normal range of motion. He exhibits no edema.  Lymphadenopathy:    He has no cervical adenopathy.  Neurological: He is alert and oriented to person, place, and time. No cranial nerve deficit.  Skin: Skin is warm and dry. Rash noted. There is erythema.  Dry scaly rash on face; flaking and erythema of extensor surfaces of extremities.  Also multiple wounds secondary to falls  Psychiatric: He has a normal mood and affect. His behavior is normal. Judgment and thought content normal.     Assessment/Plan This is a 65 year old male admitted for confusion and agitation. 1. Altered mental status: Confusion and agitation. Etiology is unknown. The patient's urine toxicity screen did not show any opiates. Tricyclics were present but the patient is prescribed Elavil. Check TSH, ceruloplasm and RPR (note pupil  findings) for reversible causes of dementia. Also, the patient's family states that he "weighs half of what he used to" and has not been eating well for years. Some of his symptoms are consistent with pellagra thus I will add vitamin B6 supplemental as well as niacin supplement to his regimen. Haldol every 6 hours as needed for agitation 2. Coronary artery disease: Stable; continue aspirin and Plavix. 3. Congestive heart failure: Systolic; stable. Continue carvedilol and a 6. 4. Diabetes mellitus type 2 with neuropathy: Continue basal insulin adjusted for hospital diet as well as sliding scale. Continue Lyrica 5. Hyperlipidemia: Continue statin therapy as well as gemfibrozil 6. Cellulitis: Appears superficial on the left hand however the patient has multiple wounds in various stages of healing which could potentially be infected. He is received vancomycin and Zosyn in the emergency department. Continue Bactrim by mouth as the patient does not meet criteria for sepsis. 7. DVT prophylaxis: Lovenox 8. GI prophylaxis: None The patient is a full code. Time spent on admission orders and patient care approximately 45 minutes  Harrie Foreman, MD 04/18/2016, 6:37 AM

## 2016-04-18 NOTE — ED Triage Notes (Signed)
Pt arrived via ems from home. Family called ems after pt became confused. Family reports similar situation has happened in the past and was found to be caused by medications he takes. Pt alert but very confused upon assessment.

## 2016-04-18 NOTE — ED Notes (Signed)
Pt voided in bed, linen and chux pad changed. Pt tolerated well.

## 2016-04-18 NOTE — Consult Note (Signed)
[facility] Face-to-Face Psychiatry Consult   Reason for Consult:  Consult for 65 year old man without any known past psychiatric history. Consult because of acute confusionKonidena Referring Physician:  Vianne Bulls Patient Identification: Mark Armstrong MRN:  [id number] Principal Diagnosis: Acute delirium Diagnosis:   Patient Active Problem List   Diagnosis Date Noted  . Acute delirium [R41.0] 04/18/2016  . Chronic pain syndrome [G89.4] 04/18/2016  . Cellulitis [L03.90] 12/20/2015    Total Time spent with patient: 1 hour  Subjective:   Mark Armstrong is a 65 y.o. male patient admitted with "I just want to get out of here".  HPI:  Patient is interviewed. Chart reviewed. Spoke with his son by phone. Tried to speak with the wife was unable to reach her on her cell phone. This is a 65 year old man who was brought in by his family last night with acute confusion. Patient is really unable to give any meaningful history. Son reports that he has been having falls much more frequently than usual for the past few months. This week it has been particularly bad he reports that the patient fell 5 times yesterday. He is having confusion for just about one day. Son reports that last night the patient began "talking out of his head" and making no sense. They are not aware of any changes to medication. On interview today the patient denies suicidal or homicidal ideation. He indicates an awareness that he is confused but is not able to stay focused enough to describe his feelings. Drug screen is negative except for tricyclics which is what would be expected from his prescribed medicines.  Patient's chief complaint right now is his back pain  Social history: The patient receives disability. Lives with his wife and a daughter AND SOME OTHER EXTENDED FAMILY.   Medical history: Chronic back pain apparently secondary to a distant injury. He is on chronic narcotic therapy which appears to of been stable for at least a few  months. Patient has elevated blood sugar presumably has diabetes.  Substance abuse history: Patient denies that he's been abusing his medication. I did not get any history about any alcohol abuse but there is no report of any of that in the current chart.   Past Psychiatric History: Patient does not have any known past psychiatric history that I can find. No previous psychiatric evaluation in the chart. Patient denies any history of psychiatric treatment. Only psychiatric medicine right now is amitriptyline which is presumably for sleep and pain  Risk to Self: Is patient at risk for suicide?: No Risk to Others:   Prior Inpatient Therapy:   Prior Outpatient Therapy:    Past Medical History:  Past Medical History:  Diagnosis Date  . 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 CHF (congestive heart failure) ([place])   . Diabetes mellitus without complication ([place])   . Glaucoma   . Hypertension   . Stroke [facility])     Past Surgical History:  Procedure Laterality Date  . CARDIAC SURGERY    . CORONARY ARTERY BYPASS GRAFT    . EYE SURGERY    . INCISION AND DRAINAGE WOUND WITH TENDON REPAIR Left 12/27/2015   Procedure: INCISION AND DRAINAGE WOUND WITH TENDON REPAIR;  Surgeon: Hessie Knows, MD;  Location: [facility] ORS;  Service: Orthopedics;  Laterality: Left;  . Right foot surgery    . Right knee arthroscopy    . TOE AMPUTATION     Family History:  Family History  Problem Relation Age of Onset  .  CAD Mother   . Diabetes Mother   . CAD Father   . Diabetes Father    Family Psychiatric  History: Unknown. Patient unable to give any history. He doesn't know of any right now. Social History:  History  Alcohol Use No     History  Drug Use No    Social History   Social History  . Marital status: Married    Spouse name: N/A  . Number of children: N/A  . Years of education: N/A   Social History Main Topics  . Smoking status: Former Research scientist (life sciences)  . Smokeless tobacco: Never Used  . Alcohol use No    . Drug use: No  . Sexual activity: Not Asked   Other Topics Concern  . None   Social History Narrative  . None   Additional Social History:    Allergies:   Allergies  Allergen Reactions  . Penicillins Rash    Labs:  Results for orders placed or performed during the hospital encounter of 04/18/16 (from the past 48 hour(s))  Culture, blood (routine x 2)     Status: None (Preliminary result)   Collection Time: 04/18/16 12:52 AM  Result Value Ref Range   Specimen Description BLOOD  LEFT ARM    Special Requests      BOTTLES DRAWN AEROBIC AND ANAEROBIC  ANA 12ML AER 11ML   Culture NO GROWTH < 12 HOURS    Report Status PENDING   Culture, blood (routine x 2)     Status: None (Preliminary result)   Collection Time: 04/18/16 12:52 AM  Result Value Ref Range   Specimen Description BLOOD  RIGHT FOREARM    Special Requests      BOTTLES DRAWN AEROBIC AND ANAEROBIC  ANA 13ML AER 13ML    Culture NO GROWTH < 12 HOURS    Report Status PENDING   Lactic acid, plasma     Status: None   Collection Time: 04/18/16 12:52 AM  Result Value Ref Range   Lactic Acid, Venous 1.7 0.5 - 1.9 mmol/L  TSH     Status: None   Collection Time: 04/18/16 12:52 AM  Result Value Ref Range   TSH 2.086 0.350 - 4.500 uIU/mL    Comment: Performed by a 3rd Generation assay with a functional sensitivity of <=0.01 uIU/mL.  CBC with Differential     Status: Abnormal   Collection Time: 04/18/16 12:54 AM  Result Value Ref Range   WBC 4.7 3.8 - 10.6 K/uL   RBC 5.46 4.40 - 5.90 MIL/uL   Hemoglobin 16.8 13.0 - 18.0 g/dL   HCT 47.8 40.0 - 52.0 %   MCV 87.5 80.0 - 100.0 fL   MCH 30.7 26.0 - 34.0 pg   MCHC 35.1 32.0 - 36.0 g/dL   RDW 14.4 11.5 - 14.5 %   Platelets 133 (L) 150 - 440 K/uL   Neutrophils Relative % 53 %   Neutro Abs 2.5 1.4 - 6.5 K/uL   Lymphocytes Relative 32 %   Lymphs Abs 1.5 1.0 - 3.6 K/uL   Monocytes Relative 13 %   Monocytes Absolute 0.6 0.2 - 1.0 K/uL   Eosinophils Relative 1 %    Eosinophils Absolute 0.0 0 - 0.7 K/uL   Basophils Relative 1 %   Basophils Absolute 0.0 0 - 0.1 K/uL  Comprehensive metabolic panel     Status: Abnormal   Collection Time: 04/18/16 12:54 AM  Result Value Ref Range   Sodium 136 135 - 145 mmol/L  Potassium 3.9 3.5 - 5.1 mmol/L   Chloride 99 (L) 101 - 111 mmol/L   CO2 28 22 - 32 mmol/L   Glucose, Bld 195 (H) 65 - 99 mg/dL   BUN 15 6 - 20 mg/dL   Creatinine, Ser 1.18 0.61 - 1.24 mg/dL   Calcium 9.1 8.9 - 10.3 mg/dL   Total Protein 7.5 6.5 - 8.1 g/dL   Albumin 3.7 3.5 - 5.0 g/dL   AST 24 15 - 41 U/L   ALT 22 17 - 63 U/L   Alkaline Phosphatase 117 38 - 126 U/L   Total Bilirubin 0.8 0.3 - 1.2 mg/dL   GFR calc non Af Amer >60 >60 mL/min   GFR calc Af Amer >60 >60 mL/min    Comment: (NOTE) The eGFR has been calculated using the CKD EPI equation. This calculation has not been validated in all clinical situations. eGFR's persistently <60 mL/min signify possible Chronic Kidney Disease.    Anion gap 9 5 - 15  Ethanol     Status: None   Collection Time: 04/18/16 12:54 AM  Result Value Ref Range   Alcohol, Ethyl (B) <5 <5 mg/dL    Comment:        LOWEST DETECTABLE LIMIT FOR SERUM ALCOHOL IS 5 mg/dL FOR MEDICAL PURPOSES ONLY   Acetaminophen level     Status: Abnormal   Collection Time: 04/18/16 12:54 AM  Result Value Ref Range   Acetaminophen (Tylenol), Serum <10 (L) 10 - 30 ug/mL    Comment:        THERAPEUTIC CONCENTRATIONS VARY SIGNIFICANTLY. A RANGE OF 10-30 ug/mL MAY BE AN EFFECTIVE CONCENTRATION FOR MANY PATIENTS. HOWEVER, SOME ARE BEST TREATED AT CONCENTRATIONS OUTSIDE THIS RANGE. ACETAMINOPHEN CONCENTRATIONS >150 ug/mL AT 4 HOURS AFTER INGESTION AND >50 ug/mL AT 12 HOURS AFTER INGESTION ARE OFTEN ASSOCIATED WITH TOXIC REACTIONS.   Salicylate level     Status: None   Collection Time: 04/18/16 12:54 AM  Result Value Ref Range   Salicylate Lvl <3.0 2.8 - 30.0 mg/dL  Troponin I     Status: None   Collection Time:  04/18/16 12:54 AM  Result Value Ref Range   Troponin I <0.03 <0.03 ng/mL  Urinalysis complete, with microscopic ([facility] only)     Status: Abnormal   Collection Time: 04/18/16  1:17 AM  Result Value Ref Range   Color, Urine YELLOW (A) YELLOW   APPearance CLOUDY (A) CLEAR   Glucose, UA >500 (A) NEGATIVE mg/dL   Bilirubin Urine NEGATIVE NEGATIVE   Ketones, ur NEGATIVE NEGATIVE mg/dL   Specific Gravity, Urine 1.008 1.005 - 1.030   Hgb urine dipstick NEGATIVE NEGATIVE   pH 6.0 5.0 - 8.0   Protein, ur NEGATIVE NEGATIVE mg/dL   Nitrite NEGATIVE NEGATIVE   Leukocytes, UA NEGATIVE NEGATIVE   RBC / HPF 0-5 0 - 5 RBC/hpf   WBC, UA 0-5 0 - 5 WBC/hpf   Bacteria, UA NONE SEEN NONE SEEN   Squamous Epithelial / LPF NONE SEEN NONE SEEN   Mucous PRESENT   Urine Drug Screen, Qualitative ([facility] only)     Status: Abnormal   Collection Time: 04/18/16  1:17 AM  Result Value Ref Range   Tricyclic, Ur Screen POSITIVE (A) NONE DETECTED   Amphetamines, Ur Screen NONE DETECTED NONE DETECTED   MDMA (Ecstasy)Ur Screen NONE DETECTED NONE DETECTED   Cocaine Metabolite,Ur [place] NONE DETECTED NONE DETECTED   Opiate, Ur Screen NONE DETECTED NONE DETECTED   Phencyclidine (PCP) Ur S NONE DETECTED NONE  DETECTED   Cannabinoid 50 Ng, Ur [place] NONE DETECTED NONE DETECTED   Barbiturates, Ur Screen NONE DETECTED NONE DETECTED   Benzodiazepine, Ur Scrn NONE DETECTED NONE DETECTED   Methadone Scn, Ur NONE DETECTED NONE DETECTED    Comment: (NOTE) 888  Tricyclics, urine               Cutoff 1000 ng/mL 200  Amphetamines, urine             Cutoff 1000 ng/mL 300  MDMA (Ecstasy), urine           Cutoff 500 ng/mL 400  Cocaine Metabolite, urine       Cutoff 300 ng/mL 500  Opiate, urine                   Cutoff 300 ng/mL 600  Phencyclidine (PCP), urine      Cutoff 25 ng/mL 700  Cannabinoid, urine              Cutoff 50 ng/mL 800  Barbiturates, urine             Cutoff 200 ng/mL 900  Benzodiazepine, urine           Cutoff 200  ng/mL 1000 Methadone, urine                Cutoff 300 ng/mL 1100 1200 The urine drug screen provides only a preliminary, unconfirmed 1300 analytical test result and should not be used for non-medical 1400 purposes. Clinical consideration and professional judgment should 1500 be applied to any positive drug screen result due to possible 1600 interfering substances. A more specific alternate chemical method 1700 must be used in order to obtain a confirmed analytical result.  1800 Gas chromato graphy / mass spectrometry (GC/MS) is the preferred 1900 confirmatory method.   Glucose, capillary     Status: None   Collection Time: 04/18/16  9:27 AM  Result Value Ref Range   Glucose-Capillary 95 65 - 99 mg/dL   Comment 1 Notify RN    Comment 2 Document in Chart   Glucose, capillary     Status: Abnormal   Collection Time: 04/18/16 12:00 PM  Result Value Ref Range   Glucose-Capillary 106 (H) 65 - 99 mg/dL   Comment 1 Notify RN    Comment 2 Document in Chart     Current Facility-Administered Medications  Medication Dose Route Frequency Provider Last Rate Last Dose  . acetaminophen (TYLENOL) tablet 650 mg  650 mg Oral Q6H PRN Harrie Foreman, MD       Or  . acetaminophen (TYLENOL) suppository 650 mg  650 mg Rectal Q6H PRN Harrie Foreman, MD      . amitriptyline (ELAVIL) tablet 150 mg  150 mg Oral Daily Harrie Foreman, MD   150 mg at 04/18/16 1026  . aspirin EC tablet 81 mg  81 mg Oral Daily Harrie Foreman, MD   81 mg at 04/18/16 1021  . atorvastatin (LIPITOR) tablet 80 mg  80 mg Oral Daily Harrie Foreman, MD   80 mg at 04/18/16 1025  . B-complex with vitamin C tablet 1 tablet  1 tablet Oral Daily Harrie Foreman, MD   1 tablet at 04/18/16 1025  . carvedilol (COREG) tablet 3.125 mg  3.125 mg Oral BID Harrie Foreman, MD   3.125 mg at 04/18/16 1022  . clopidogrel (PLAVIX) tablet 75 mg  75 mg Oral Daily Harrie Foreman, MD   75 mg at  04/18/16 1021  . colchicine tablet 0.6 mg   0.6 mg Oral BID Harrie Foreman, MD   0.6 mg at 04/18/16 1025  . cyclobenzaprine (FLEXERIL) tablet 10 mg  10 mg Oral BID PRN Harrie Foreman, MD   10 mg at 04/18/16 1039  . docusate sodium (COLACE) capsule 100 mg  100 mg Oral BID Harrie Foreman, MD   100 mg at 04/18/16 1022  . enoxaparin (LOVENOX) injection 40 mg  40 mg Subcutaneous Q24H Harrie Foreman, MD   40 mg at 04/18/16 1026  . furosemide (LASIX) tablet 40 mg  40 mg Oral BID Harrie Foreman, MD   40 mg at 04/18/16 1022  . gemfibrozil (LOPID) tablet 600 mg  600 mg Oral BID Harrie Foreman, MD   600 mg at 04/18/16 1021  . haloperidol lactate (HALDOL) injection 1 mg  1 mg Intravenous STAT Gonzella Lex, MD      . haloperidol lactate (HALDOL) injection 1 mg  1 mg Intravenous Q6H PRN Gonzella Lex, MD      . haloperidol lactate (HALDOL) injection 2 mg  2 mg Intravenous Q6H PRN Harrie Foreman, MD   2 mg at 04/18/16 1017  . ibuprofen (ADVIL,MOTRIN) tablet 800 mg  800 mg Oral Q8H PRN Harrie Foreman, MD   800 mg at 04/18/16 1039  . [START ON 04/19/2016] Influenza vac split quadrivalent PF (FLUARIX) injection 0.5 mL  0.5 mL Intramuscular Tomorrow-1000 Epifanio Lesches, MD      . insulin aspart (novoLOG) injection 0-15 Units  0-15 Units Subcutaneous TID WC Harrie Foreman, MD      . insulin glargine (LANTUS) injection 40 Units  40 Units Subcutaneous QHS Harrie Foreman, MD      . lisinopril (PRINIVIL,ZESTRIL) tablet 5 mg  5 mg Oral Daily Harrie Foreman, MD   5 mg at 04/18/16 1021  . LORazepam (ATIVAN) 2 MG/ML injection           . metFORMIN (GLUCOPHAGE) tablet 500 mg  500 mg Oral BID WC Harrie Foreman, MD   500 mg at 04/18/16 1020  . niacin CR capsule 500 mg  500 mg Oral QHS Harrie Foreman, MD      . ondansetron [facility]) tablet 4 mg  4 mg Oral Q6H PRN Harrie Foreman, MD       Or  . ondansetron [facility]) injection 4 mg  4 mg Intravenous Q6H PRN Harrie Foreman, MD      . pantoprazole (PROTONIX) EC tablet 40 mg   40 mg Oral Daily Harrie Foreman, MD   40 mg at 04/18/16 1020  . potassium chloride SA (K-DUR,KLOR-CON) CR tablet 20 mEq  20 mEq Oral Daily Harrie Foreman, MD   20 mEq at 04/18/16 1022  . pregabalin (LYRICA) capsule 100 mg  100 mg Oral TID Harrie Foreman, MD   100 mg at 04/18/16 1022  . sulfamethoxazole-trimethoprim (BACTRIM DS,SEPTRA DS) 800-160 MG per tablet 1 tablet  1 tablet Oral BID Harrie Foreman, MD   1 tablet at 04/18/16 1025  . zolpidem (AMBIEN) tablet 5 mg  5 mg Oral QHS PRN Harrie Foreman, MD        Musculoskeletal: Strength & Muscle Tone: atrophy Gait & Station: unable to stand Patient leans: Backward  Psychiatric Specialty Exam: Physical Exam  Nursing note and vitals reviewed. Constitutional: He appears well-developed and well-nourished.  HENT:  Head: Normocephalic and atraumatic.  Eyes: Conjunctivae are normal. Pupils are equal, round, and reactive to light.  Neck: Normal range of motion.  Cardiovascular: Regular rhythm and normal heart sounds.   Respiratory: Effort normal.  GI: Soft.  Musculoskeletal: Normal range of motion.  Neurological: He is alert.  Skin: Skin is warm and dry.  Psychiatric: His affect is blunt. His speech is delayed. He is agitated, slowed and withdrawn. He expresses impulsivity. He exhibits abnormal recent memory and abnormal remote memory. He is inattentive.    Review of Systems  Musculoskeletal: Positive for back pain.    Blood pressure 121/90, pulse 91, temperature 97.6 F (36.4 C), temperature source Oral, resp. rate 16, height 6' 1"  (1.854 m), weight 95.3 kg (210 lb), SpO2 95 %.Body mass index is 27.71 kg/m.  General Appearance: Disheveled  Eye Contact:  None  Speech:  Garbled  Volume:  Decreased  Mood:  Irritable  Affect:  Inappropriate  Thought Process:  Disorganized  Orientation:  Other:  Oriented to place and year initially although unable to describe his reason for being in the hospital.  Thought Content:   Illogical, Rumination and Tangential  Suicidal Thoughts:  No  Homicidal Thoughts:  No  Memory:  Immediate;   Poor Recent;   Poor Remote;   Poor Patient was unable to repeat 3 words after even several tries  Judgement:  Impaired  Insight:  Lacking  Psychomotor Activity:  Restlessness  Concentration:  Concentration: Poor  Recall:  Poor  Fund of Knowledge:  Fair  Language:  Fair  Akathisia:  No  Handed:  Right  AIMS (if indicated):     Assets:  Housing Social Support  ADL's:  Impaired  Cognition:  Impaired,  Moderate and Severe  Sleep:        Treatment Plan Summary: Medication management and Plan This is a 65 year old man who is acutely delirious to my examination today. He is able to have brief periods of lucid conversation but then becomes tangential. He is talking to himself and picking at the air and pulling at his clothing. Made no eye contact essentially. Not able to stay on topic. Clinically delirious. Differential diagnosis would be the usual suspects of infection, metabolic, drugs and trauma among others. So far lab tests are only revealing of a moderately elevated blood sugar. No clear sign of an infection. He is on pretty high doses of narcotic pain medicines between his oxycodone and fentanyl chronically as well as being on Ambien at night. I have concern about this as a possible cause of delirium from his medication although this acute delirium doesn't have a specific explanation. The son does mention that he had multiple falls yesterday which raises the possibility of a head injury although the CT scan is unremarkable. I have ordered when necessary doses of haloperidol intravenously for agitation. I will follow-up as needed.  Disposition: Patient does not meet criteria for psychiatric inpatient admission. Supportive therapy provided about ongoing stressors.  Alethia Berthold, MD 04/18/2016 1:28 PM

## 2016-04-18 NOTE — ED Notes (Signed)
Dr Diamond at bedside. 

## 2016-04-18 NOTE — ED Notes (Signed)
Pt assisted with urinal while laying in bed; voided clear yellow urine

## 2016-04-18 NOTE — ED Notes (Signed)
Pt taken to CT assist by this RN and Wende NeighborsSarah Blake, ED tech

## 2016-04-18 NOTE — ED Notes (Signed)
Pt arrived restless but not combative as described by EMS; pt has periods of clear conversation mixed with visual hallucination and confusion; sitter to remain at bedside for pt safety; pads on siderails for pt safety;

## 2016-04-19 LAB — URINE CULTURE
Culture: NO GROWTH
Special Requests: NORMAL

## 2016-04-19 LAB — GLUCOSE, CAPILLARY
Glucose-Capillary: 95 mg/dL (ref 65–99)
Glucose-Capillary: 90 mg/dL (ref 65–99)

## 2016-04-19 LAB — RPR: RPR Ser Ql: NONREACTIVE

## 2016-04-19 LAB — HEMOGLOBIN A1C
Hgb A1c MFr Bld: 12.6 % — ABNORMAL HIGH (ref 4.8–5.6)
Mean Plasma Glucose: 315 mg/dL

## 2016-04-19 LAB — CERULOPLASMIN: Ceruloplasmin: 22.4 mg/dL (ref 16.0–31.0)

## 2016-04-19 MED ORDER — KETOROLAC TROMETHAMINE 15 MG/ML IJ SOLN
15.0000 mg | Freq: Once | INTRAMUSCULAR | Status: AC
  Administered 2016-04-19: 06:00:00 15 mg via INTRAVENOUS
  Filled 2016-04-19: qty 1

## 2016-04-19 NOTE — Evaluation (Signed)
Physical Therapy Evaluation Patient Details Name: Mark LowensteinBoyd D Ezelle MRN: [id number] DOB: [birth year] Today's Date: 04/19/2016   History of Present Illness  Mark Armstrong is an 65 y.o. male.  with past medical history of diabetes and coronary artery disease status post bypass presents emergency department with confusion and agitation.     Clinical Impression  Pt presents to PT with generalized weakness, decreased balance, and history of falls and would benefit from acute PT services to address objective findings.  Pt's cognition appears to be at baseline and is able to follow all commands appropriately and carry on conversation.   Pt is Mod I for basic mobility but demonstrates decreased strength and power with transitions.  Pt would benefit from higher level balance assessment and training.  Recommend pt to continue with outpatient services receiving prior to admission.     Follow Up Recommendations Outpatient PT    Equipment Recommendations  None recommended by PT (has needed)    Recommendations for Other Services       Precautions / Restrictions Precautions Precautions: Fall Restrictions Weight Bearing Restrictions: No      Mobility  Bed Mobility Overal bed mobility: Modified Independent             General bed mobility comments: increased time/effort to rise using momentum to swing legs over side of bed  Transfers Overall transfer level: Needs assistance Equipment used: Quad cane Transfers: Sit to/from Stand Sit to Stand: Supervision         General transfer comment: Supervision with sit<>stand transfers, slow to rise and bracing legs against bed for stabiltiy  Ambulation/Gait Ambulation/Gait assistance: Supervision Ambulation Distance (Feet): 180 Feet Assistive device: Quad cane Gait Pattern/deviations: Step-through pattern     General Gait Details: Pt able to amb with quad cane and negotiate objects in hallway, maintain appropriate conversation with therapist  throughout; no frank balance deviations or corrections needed on level surface; increased lateral sway  Stairs            Wheelchair Mobility    Modified Rankin (Stroke Patients Only)       Balance Overall balance assessment: Modified Independent;History of Falls (for basic balance activities)                                           Pertinent Vitals/Pain Pain Assessment: No/denies pain    Home Living Family/patient expects to be discharged to:: Private residence Living Arrangements: Spouse/significant other;Children Available Help at Discharge: Family Type of Home: House Home Access: Stairs to enter   Secretary/administratorntrance Stairs-Number of Steps: 5-7 Home Layout: One level Home Equipment: Cane - quad      Prior Function Level of Independence: Needs assistance   Gait / Transfers Assistance Needed: Mod I with quad cane  ADL's / Homemaking Assistance Needed: Wife assists with doning socks.  Comments: Spends most of his time in recliner; seeing outpatient PT for strengthening.     Hand Dominance        Extremity/Trunk Assessment   Upper Extremity Assessment: Overall WFL for tasks assessed           Lower Extremity Assessment: Generalized weakness;LLE deficits/detail   LLE Deficits / Details: Charcot foot     Communication   Communication: No difficulties  Cognition Arousal/Alertness: Awake/alert Behavior During Therapy: WFL for tasks assessed/performed Overall Cognitive Status: Within Functional Limits for tasks assessed  General Comments General comments (skin integrity, edema, etc.): L middle finger contracted; multiple bandages intact    Exercises     Assessment/Plan    PT Assessment Patient needs continued PT services  PT Problem List Decreased strength;Decreased activity tolerance;Decreased balance          PT Treatment Interventions Gait training;Stair training;Functional mobility  training;Therapeutic activities;Therapeutic exercise;Balance training    PT Goals (Current goals can be found in the Care Plan section)  Acute Rehab PT Goals Patient Stated Goal: None stated. PT Goal Formulation: With patient Time For Goal Achievement: 04/26/16 Potential to Achieve Goals: Good    Frequency Min 2X/week   Barriers to discharge        Co-evaluation               End of Session Equipment Utilized During Treatment: Gait belt Activity Tolerance: Patient tolerated treatment well Patient left: in chair;with call bell/phone within reach;with nursing/sitter in room;with family/visitor present Nurse Communication: Mobility status    Functional Assessment Tool Used: AMPAC Functional Limitation: Mobility: Walking and moving around Mobility: Walking and Moving Around Current Status [phone](G8978): At least 1 percent but less than 20 percent impaired, limited or restricted Mobility: Walking and Moving Around Goal Status [phone](G8979): At least 1 percent but less than 20 percent impaired, limited or restricted    Time: 1100-1127 PT Time Calculation (min) (ACUTE ONLY): 27 min   Charges:   PT Evaluation $PT Eval Low Complexity: 1 Procedure     PT G Codes:   PT G-Codes **NOT FOR INPATIENT CLASS** Functional Assessment Tool Used: AMPAC Functional Limitation: Mobility: Walking and moving around Mobility: Walking and Moving Around Current Status ([phone]): At least 1 percent but less than 20 percent impaired, limited or restricted Mobility: Walking and Moving Around Goal Status [phone](G8979): At least 1 percent but less than 20 percent impaired, limited or restricted    Pulte Homesisele A Peggie Hornak, PT 04/19/2016, 11:38 AM

## 2016-04-19 NOTE — Care Management (Addendum)
Admitted to [facility] under observation status with the diagnosis of acute delirium. Lives with wife, Mark Armstrong [phone] or [phone]).  States his blind son and 2 grand children are in the home. Last seen Dr. Marolyn HallerHeffington a couple of months ago. Takes care of all basic activities of daily living himself, doesn't drive. Many falls in the last few days.  Prescriptions are filled at [facility] in [place]. Wife will transport. Mr. Mark Armstrong answered all questions appropriately.  Sitter at the bedside. Gwenette GreetBrenda S Jaedah Lords RN MSN [facility] Care Management

## 2016-04-19 NOTE — Evaluation (Signed)
Clinical/Bedside Swallow Evaluation Patient Details  Name: Mark Armstrong MRN: [id number] Date of Birth: 02-16-1951  Today's Date: 04/19/2016 Time: SLP Start Time (ACUTE ONLY): 1204 SLP Stop Time (ACUTE ONLY): 1250 SLP Time Calculation (min) (ACUTE ONLY): 46 min  Past Medical History:  Past Medical History:  Diagnosis Date  . 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 CHF (congestive heart failure) (HCC)   . Diabetes mellitus without complication (HCC)   . Glaucoma   . Hypertension   . Stroke [facility](HCC)    Past Surgical History:  Past Surgical History:  Procedure Laterality Date  . CARDIAC SURGERY    . CORONARY ARTERY BYPASS GRAFT    . EYE SURGERY    . INCISION AND DRAINAGE WOUND WITH TENDON REPAIR Left 12/27/2015   Procedure: INCISION AND DRAINAGE WOUND WITH TENDON REPAIR;  Surgeon: Kennedy BuckerMichael Menz, MD;  Location: [facility] ORS;  Service: Orthopedics;  Laterality: Left;  . Right foot surgery    . Right knee arthroscopy    . TOE AMPUTATION     HPI:  Pt The patient with past medical history of diabetes and coronary artery disease status post bypass presents emergency department with confusion and agitation. The patient's wife and daughter states that he has rapidly become incoherent over the last 36 hours. In the week preceding admission he has become more unstable and has had 5 falls yesterday. The patient is unable to contribute to his history as he is agitated and hallucinating. Family state that this is happened once before but that the etiology of that episode was never found. EMS administered intranasal Narcan and removed the patient's fentanyl patch which made him more alert but no more clear. CT of the head was unremarkable, but the patient received multiple doses of Haldol, Benadryl, morphine and Ativan for sedation. At the time of my exam all of these medicines had worn off. The patient was not violent but very agitated which prompted the emergency department staff to call for admission. Pt  alert and verbally conversive now; following instructions.    Assessment / Plan / Recommendation Clinical Impression  Pt appears to present w/ no oropharyngeal phase dysphagia during this assessment; no overt s/s fo aspiration were noted during po trials. Oral phase was wfl for bolus management. Of note, pt does have GERD w/ episodes of globus/regurgitation stated by pt - he is on NExium at home as a PPI. Education given on general aspiration precautions; Reflux precautions w/ recommendation to f/u w/ GI for ongoing management of Esophageal dysmotility/dysphagia. Pt and wife agreed.     Aspiration Risk   (reduced following general aspiration precautions; Reflux)    Diet Recommendation  Regular/Mech Soft diet (chopped, moist meats); Thin liquids. General aspiration precautions; REFLUX precautions.    Medication Administration: Whole meds with liquid (w/ puree if helpful)    Other  Recommendations Recommended Consults: Consider GI evaluation (ongoing tx) Oral Care Recommendations: Oral care BID;Staff/trained caregiver to provide oral care   Follow up Recommendations None      Frequency and Duration            Prognosis Prognosis for Safe Diet Advancement: Good Barriers to Reach Goals:  (GERD)      Swallow Study   General Date of Onset: 04/18/16 HPI: Pt The patient with past medical history of diabetes and coronary artery disease status post bypass presents emergency department with confusion and agitation. The patient's wife and daughter states that he has rapidly become incoherent over the last 36 hours.  In the week preceding admission he has become more unstable and has had 5 falls yesterday. The patient is unable to contribute to his history as he is agitated and hallucinating. Family state that this is happened once before but that the etiology of that episode was never found. EMS administered intranasal Narcan and removed the patient's fentanyl patch which made him more alert but no  more clear. CT of the head was unremarkable, but the patient received multiple doses of Haldol, Benadryl, morphine and Ativan for sedation. At the time of my exam all of these medicines had worn off. The patient was not violent but very agitated which prompted the emergency department staff to call for admission. Pt alert and verbally conversive now; following instructions.  Type of Study: Bedside Swallow Evaluation Previous Swallow Assessment: none Diet Prior to this Study: Regular;Thin liquids Temperature Spikes Noted: No (wbc not elevated) Respiratory Status: Room air History of Recent Intubation: No Behavior/Cognition: Alert;Cooperative;Pleasant mood (had been more confused prior) Oral Cavity Assessment: Within Functional Limits Oral Care Completed by SLP: Recent completion by staff Oral Cavity - Dentition: Missing dentition Vision: Functional for self-feeding Self-Feeding Abilities: Able to feed self;Needs set up (weak hands; decreased ROM/feeling in fingers) Patient Positioning: Upright in bed (EOB) Baseline Vocal Quality: Normal Volitional Cough: Strong Volitional Swallow: Able to elicit    Oral/Motor/Sensory Function Overall Oral Motor/Sensory Function: Within functional limits   Ice Chips Ice chips: Not tested   Thin Liquid Thin Liquid: Within functional limits Presentation: Self Fed (by bottle; ~3-4 ozs) Other Comments: discussed precautions    Nectar Thick Nectar Thick Liquid: Not tested   Honey Thick Honey Thick Liquid: Not tested   Puree Puree: Not tested   Solid   GO   Solid: Within functional limits Presentation: Spoon;Self Fed (3-4 trials)    Functional Assessment Tool Used: clinicial judgement Functional Limitations: Swallowing Swallow Current Status ([id number]([id number]): At least 1 percent but less than 20 percent impaired, limited or restricted Swallow Goal Status [phone]([id number]): At least 1 percent but less than 20 percent impaired, limited or restricted Swallow Discharge Status  [phone]([id number]): At least 1 percent but less than 20 percent impaired, limited or restricted    Mark SomKatherine Tatiana Courter, MS, CCC-SLP  Afra Tricarico 04/19/2016,2:10 PM

## 2016-04-19 NOTE — Care Management Obs Status (Signed)
MEDICARE OBSERVATION STATUS NOTIFICATION   Patient Details  Name: Mark LowensteinBoyd D Zell MRN: [id number] Date of Birth: [birth year]   Medicare Observation Status Notification Given:  Yes    Gwenette GreetBrenda S Bernyce Brimley, RN 04/19/2016, 10:21 AM

## 2016-04-19 NOTE — Progress Notes (Signed)
Patient discharged home per MD order. All discharge instructions given and all questions answered. 

## 2016-04-23 LAB — CULTURE, BLOOD (ROUTINE X 2)
CULTURE: NO GROWTH
Culture: NO GROWTH

## 2016-04-23 NOTE — Discharge Summary (Signed)
Mark Armstrong, is a 65 y.o. male  DOB [birth year]  MRN [id number].  Admission date:  04/18/2016  Admitting Physician  Arnaldo Natal, MD  Discharge Date:  04/19/2016   Primary MD  Sharilyn Sites, MD  Recommendations for primary care physician for things to follow:   Follow up with PMD in one week.   Admission Diagnosis  Confusion [R41.0] Cellulitis of finger of left hand [L03.012] Altered mental status, unspecified altered mental status type [R41.82]   Discharge Diagnosis  Confusion [R41.0] Cellulitis of finger of left hand [L03.012] Altered mental status, unspecified altered mental status type [R41.82]    Principal Problem:   Acute delirium Active Problems:   Chronic pain syndrome      Past Medical History:  Diagnosis Date  . 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 CHF (congestive heart failure) (HCC)   . Diabetes mellitus without complication (HCC)   . Glaucoma   . Hypertension   . Stroke [facility])     Past Surgical History:  Procedure Laterality Date  . CARDIAC SURGERY    . CORONARY ARTERY BYPASS GRAFT    . EYE SURGERY    . INCISION AND DRAINAGE WOUND WITH TENDON REPAIR Left 12/27/2015   Procedure: INCISION AND DRAINAGE WOUND WITH TENDON REPAIR;  Surgeon: Kennedy Bucker, MD;  Location: [facility] ORS;  Service: Orthopedics;  Laterality: Left;  . Right foot surgery    . Right knee arthroscopy    . TOE AMPUTATION         History of present illness and  Hospital Course:     Kindly see H&P for history of present illness and admission details, please review complete Labs, Consult reports and Test reports for all details in brief  HPI  from the history and physical done on the day of admission  65 yr old male  With h/o htn,DMII,chf,stroke,chronic back pain and leg pain  Brought in by family for  Worsening  confusion,hallucinationd ,mmultiple falls in last 36 hrs.recieved narcan by EMS,multiple doses of ativan ,haldol were given in Er and is admitted to hospital for encephalopathy.  Hospital Course   Altered mental status: and acute delerium due to metabolic encephalopathy due to  Polypharmacy ;narcotics,flexeril,taking 50 mg fentany patch ,recently increased from 25 mcg by his PMD due to back pain.pt also takes norco 5/325 mg po every 6 hrs.so his confusion due to narcotics and flexeril. Pt received  Iv fluids,pt felt better next day,became more coherent,and started to eat.   2. Coronary artery disease: Stable; continue aspirin and Plavix. 3. Congestive heart failure: Systolic; stable. Continue carvedilol ,lasix. 4. Diabetes mellitus type 2 with neuropathy: Continue basal insulin  5. Hyperlipidemia: Continue statin therapy as well as gemfibrozil 6. Cellulitis: Appears superficial on the left hand however the patient has multiple wounds in various stages of healing which could potentially be infected. He is received vancomycin and Zosyn in the emergency department. Continued Bactrim by mouth as the patient does not meet criteria for sepsis. 7.multiple falls ,PT recommends    out pt PT.    Discharge Condition: stable   Follow UP  Follow-up Information    Sharilyn Sites, MD. Go on 04/24/2016.   Specialty:  Family Medicine Why:  Dec 6th at 4 pm Contact information: [address] [place] [address] [phone]             Discharge Instructions  and  Discharge Medications  stable  Discharge Instructions    Ambulatory referral to  Physical Therapy    Complete by:  As directed        Medication List    STOP taking these medications   cyclobenzaprine 10 MG tablet Commonly known as:  FLEXERIL   fentaNYL 25 MCG/HR patch Commonly known as:  DURAGESIC - dosed mcg/hr   oxyCODONE-acetaminophen 10-325 MG tablet Commonly known as:  PERCOCET   sulfamethoxazole-trimethoprim  800-160 MG tablet Commonly known as:  BACTRIM DS,SEPTRA DS     TAKE these medications   amitriptyline 150 MG tablet Commonly known as:  ELAVIL Take 150 mg by mouth daily.   aspirin 81 MG tablet Take 81 mg by mouth daily.   atorvastatin 80 MG tablet Commonly known as:  LIPITOR Take 80 mg by mouth daily.   carvedilol 12.5 MG tablet Commonly known as:  COREG Take 3.125 mg by mouth 2 (two) times daily.   clopidogrel 75 MG tablet Commonly known as:  PLAVIX Take 75 mg by mouth daily.   colchicine 0.6 MG tablet Take 0.6 mg by mouth 2 (two) times daily.   esomeprazole 40 MG capsule Commonly known as:  NEXIUM Take 40 mg by mouth daily.   furosemide 40 MG tablet Commonly known as:  LASIX Take 40 mg by mouth 2 (two) times daily.   gemfibrozil 600 MG tablet Commonly known as:  LOPID Take 600 mg by mouth 2 (two) times daily.   glipiZIDE 10 MG 24 hr tablet Commonly known as:  GLUCOTROL XL Take 20 mg by mouth daily.   HYDROcodone-acetaminophen 5-325 MG tablet Commonly known as:  NORCO Take 1 tablet by mouth every 6 (six) hours as needed for moderate pain.   LANTUS 100 UNIT/ML injection Generic drug:  insulin glargine Inject 55 Units into the skin at bedtime.   lisinopril 5 MG tablet Commonly known as:  PRINIVIL,ZESTRIL Take 5 mg by mouth daily.   metFORMIN 500 MG tablet Commonly known as:  GLUCOPHAGE Take 500 mg by mouth 2 (two) times daily with a meal.   potassium chloride SA 20 MEQ tablet Commonly known as:  K-DUR,KLOR-CON Take 20 mEq by mouth daily.   pregabalin 100 MG capsule Commonly known as:  LYRICA Take 100 mg by mouth 3 (three) times daily.   zolpidem 5 MG tablet Commonly known as:  AMBIEN Take 5 mg by mouth at bedtime as needed for sleep.         Diet and Activity recommendation: See Discharge Instructions above   Consults obtained - psych   Major procedures and Radiology Reports - PLEASE review detailed and final reports for all details,  in brief -     Ct Head Wo Contrast  Result Date: 04/18/2016 CLINICAL DATA:  Altered mental status. EXAM: CT HEAD WITHOUT CONTRAST TECHNIQUE: Contiguous axial images were obtained from the base of the skull through the vertex without intravenous contrast. COMPARISON:  Head CT 03/10/2015 FINDINGS: Despite efforts by the technologist and patient, motion artifact is present on today's examination and could not be eliminated. This reduces the sensitivity and specificity of the study. Brain: No mass lesion, intraparenchymal hemorrhage or extra-axial collection. No evidence of acute cortical infarct. Brain parenchyma and CSF-containing spaces are normal for age. Vascular: No hyperdense vessel or unexpected calcification. Skull: Normal visualized skull base, calvarium and extracranial soft tissues. Sinuses/Orbits: No sinus fluid levels or advanced mucosal thickening. No mastoid effusion. Normal orbits. IMPRESSION: 1. Examination markedly degraded by motion. 2. Within that limitation, normal head CT for age. Electronically Signed   By: Deatra RobinsonKevin  Herman  M.D.   On: 04/18/2016 03:35   Dg Chest Port 1 View  Result Date: 04/18/2016 CLINICAL DATA:  Confusion EXAM: PORTABLE CHEST 1 VIEW COMPARISON:  01/07/2012 FINDINGS: Single-view chest demonstrates median sternotomy changes. There are low lung volumes. Linear atelectasis or scar at the left base. No acute consolidation or effusion. Stable cardiomediastinal silhouette. No pneumothorax. Left carotid artery calcification. IMPRESSION: Low lung volumes with linear atelectasis or scar at the left base. Electronically Signed   By: Jasmine PangKim  Fujinaga M.D.   On: 04/18/2016 01:53    Micro Results    Recent Results (from the past 240 hour(s))  Culture, blood (routine x 2)     Status: None   Collection Time: 04/18/16 12:52 AM  Result Value Ref Range Status   Specimen Description BLOOD  LEFT ARM  Final   Special Requests   Final    BOTTLES DRAWN AEROBIC AND ANAEROBIC  ANA 12ML  AER 11ML   Culture NO GROWTH 5 DAYS  Final   Report Status 04/23/2016 FINAL  Final  Culture, blood (routine x 2)     Status: None   Collection Time: 04/18/16 12:52 AM  Result Value Ref Range Status   Specimen Description BLOOD  RIGHT FOREARM  Final   Special Requests   Final    BOTTLES DRAWN AEROBIC AND ANAEROBIC  ANA 13ML AER 13ML    Culture NO GROWTH 5 DAYS  Final   Report Status 04/23/2016 FINAL  Final  Urine culture     Status: None   Collection Time: 04/18/16  1:17 AM  Result Value Ref Range Status   Specimen Description URINE, RANDOM  Final   Special Requests Normal  Final   Culture [facility] GROWTH [facility]   Final   Report Status 04/19/2016 FINAL  Final       Today   Subjective:   Mark Armstrong today has no headache,no chest abdominal pain,no new weakness tingling or numbness, feels much better wants to go home today.   Objective:   Blood pressure 138/62, pulse (!) 50, temperature 99.2 F (37.3 C), temperature source Oral, resp. rate 18, height 6\' 1"  (1.854 m), weight 93.3 kg (205 lb 9.6 oz), SpO2 97 %.  No intake or output data in the 24 hours ending 04/23/16 0937  Exam Awake Alert, Oriented x 3, No new F.N deficits, Normal affect [place].AT,PERRAL Supple Neck,No JVD, No cervical lymphadenopathy appriciated.  Symmetrical Chest wall movement, Good air movement bilaterally, CTAB RRR,No Gallops,Rubs or new Murmurs, No Parasternal Heave +ve B.Sounds, Abd Soft, Non tender, No organomegaly appriciated, No rebound -guarding or rigidity. No Cyanosis, Clubbing or edema, No new Rash or bruise  Data Review   CBC w Diff:  Lab Results  Component Value Date   WBC 4.7 04/18/2016   HGB 16.8 04/18/2016   HGB 13.6 02/09/2012   HCT 47.8 04/18/2016   HCT 38.8 (L) 02/09/2012   PLT 133 (L) 04/18/2016   PLT 120 (L) 02/09/2012   LYMPHOPCT 32 04/18/2016   LYMPHOPCT 18.8 01/09/2012   MONOPCT 13 04/18/2016   MONOPCT 9.2 01/09/2012   EOSPCT 1 04/18/2016   EOSPCT  1.3 01/09/2012   BASOPCT 1 04/18/2016   BASOPCT 0.7 01/09/2012    CMP:  Lab Results  Component Value Date   NA 136 04/18/2016   NA 136 02/09/2012   K 3.9 04/18/2016   K 3.9 02/09/2012   CL 99 (L) 04/18/2016   CL 98 02/09/2012   CO2 28 04/18/2016  CO2 29 02/09/2012   BUN 15 04/18/2016   BUN 14 02/09/2012   CREATININE 1.18 04/18/2016   CREATININE 1.02 02/09/2012   PROT 7.5 04/18/2016   PROT 6.6 01/07/2012   ALBUMIN 3.7 04/18/2016   ALBUMIN 3.2 (L) 01/07/2012   BILITOT 0.8 04/18/2016   BILITOT 0.9 01/07/2012   ALKPHOS 117 04/18/2016   ALKPHOS 104 01/07/2012   AST 24 04/18/2016   AST 32 01/07/2012   ALT 22 04/18/2016   ALT 22 01/07/2012  .   Total Time in preparing paper work, data evaluation and todays exam - 35 minutes  Szymon Foiles M.D on 04/19/2016 at 9:37 AM    Note: This dictation was prepared with Dragon dictation along with smaller phrase technology. Any transcriptional errors that result from this process are unintentional.

## 2016-06-16 ENCOUNTER — Observation Stay
Admission: EM | Admit: 2016-06-16 | Discharge: 2016-06-17 | Disposition: A | Discharge status: Home / Self Care | Payer: Medicare Other | Attending: Internal Medicine | Admitting: Internal Medicine

## 2016-06-16 ENCOUNTER — Encounter: Payer: Self-pay | Admitting: Specialist

## 2016-06-16 ENCOUNTER — Emergency Department: Payer: Medicare Other

## 2016-06-16 DIAGNOSIS — W010XXA Fall on same level from slipping, tripping and stumbling without subsequent striking against object, initial encounter: Secondary | ICD-10-CM | POA: Diagnosis not present

## 2016-06-16 DIAGNOSIS — Z794 Long term (current) use of insulin: Secondary | ICD-10-CM | POA: Insufficient documentation

## 2016-06-16 DIAGNOSIS — Z951 Presence of aortocoronary bypass graft: Secondary | ICD-10-CM | POA: Diagnosis not present

## 2016-06-16 DIAGNOSIS — Z7902 Long term (current) use of antithrombotics/antiplatelets: Secondary | ICD-10-CM | POA: Insufficient documentation

## 2016-06-16 DIAGNOSIS — M109 Gout, unspecified: Secondary | ICD-10-CM | POA: Diagnosis not present

## 2016-06-16 DIAGNOSIS — I509 Heart failure, unspecified: Secondary | ICD-10-CM | POA: Diagnosis not present

## 2016-06-16 DIAGNOSIS — M545 Low back pain: Secondary | ICD-10-CM | POA: Diagnosis not present

## 2016-06-16 DIAGNOSIS — Z8673 Personal history of transient ischemic attack (TIA), and cerebral infarction without residual deficits: Secondary | ICD-10-CM | POA: Diagnosis not present

## 2016-06-16 DIAGNOSIS — K219 Gastro-esophageal reflux disease without esophagitis: Secondary | ICD-10-CM | POA: Diagnosis not present

## 2016-06-16 DIAGNOSIS — R262 Difficulty in walking, not elsewhere classified: Secondary | ICD-10-CM

## 2016-06-16 DIAGNOSIS — E785 Hyperlipidemia, unspecified: Secondary | ICD-10-CM | POA: Diagnosis not present

## 2016-06-16 DIAGNOSIS — R296 Repeated falls: Secondary | ICD-10-CM

## 2016-06-16 DIAGNOSIS — Z7982 Long term (current) use of aspirin: Secondary | ICD-10-CM | POA: Insufficient documentation

## 2016-06-16 DIAGNOSIS — Z87891 Personal history of nicotine dependence: Secondary | ICD-10-CM | POA: Insufficient documentation

## 2016-06-16 DIAGNOSIS — I110 Hypertensive heart disease with heart failure: Secondary | ICD-10-CM | POA: Diagnosis not present

## 2016-06-16 DIAGNOSIS — Y92009 Unspecified place in unspecified non-institutional (private) residence as the place of occurrence of the external cause: Secondary | ICD-10-CM | POA: Insufficient documentation

## 2016-06-16 DIAGNOSIS — E119 Type 2 diabetes mellitus without complications: Secondary | ICD-10-CM | POA: Insufficient documentation

## 2016-06-16 DIAGNOSIS — R410 Disorientation, unspecified: Secondary | ICD-10-CM | POA: Diagnosis present

## 2016-06-16 DIAGNOSIS — G600 Hereditary motor and sensory neuropathy: Secondary | ICD-10-CM | POA: Insufficient documentation

## 2016-06-16 DIAGNOSIS — Z79899 Other long term (current) drug therapy: Secondary | ICD-10-CM

## 2016-06-16 LAB — GLUCOSE, CAPILLARY: Glucose-Capillary: 255 mg/dL — ABNORMAL HIGH (ref 65–99)

## 2016-06-16 LAB — BASIC METABOLIC PANEL
ANION GAP: 6 (ref 5–15)
BUN: 20 mg/dL (ref 6–20)
CALCIUM: 9.1 mg/dL (ref 8.9–10.3)
CHLORIDE: 102 mmol/L (ref 101–111)
CO2: 27 mmol/L (ref 22–32)
Creatinine, Ser: 1.07 mg/dL (ref 0.61–1.24)
GFR calc Af Amer: >60 mL/min (ref >60)
GFR calc non Af Amer: >60 mL/min (ref >60)
GLUCOSE: 282 mg/dL — AB (ref 65–99)
Potassium: 4.3 mmol/L (ref 3.5–5.1)
Sodium: 135 mmol/L (ref 135–145)

## 2016-06-16 LAB — CBC
HCT: 41.1 % (ref 40.0–52.0)
HEMOGLOBIN: 14.6 g/dL (ref 13.0–18.0)
MCH: 31.3 pg (ref 26.0–34.0)
MCHC: 35.5 g/dL (ref 32.0–36.0)
MCV: 88.2 fL (ref 80.0–100.0)
Platelets: 150 10*3/uL (ref 150–440)
RBC: 4.66 MIL/uL (ref 4.40–5.90)
RDW: 15.4 % — ABNORMAL HIGH (ref 11.5–14.5)
WBC: 6.2 10*3/uL (ref 3.8–10.6)

## 2016-06-16 LAB — URINALYSIS, COMPLETE (UACMP) WITH MICROSCOPIC
BILIRUBIN URINE: NEGATIVE
Bacteria, UA: NONE SEEN
HGB URINE DIPSTICK: NEGATIVE
KETONES UR: NEGATIVE mg/dL
LEUKOCYTES UA: NEGATIVE
NITRITE: NEGATIVE
PROTEIN: NEGATIVE mg/dL
Specific Gravity, Urine: 1.021 (ref 1.005–1.030)
Squamous Epithelial / LPF: NONE SEEN
pH: 6 (ref 5.0–8.0)

## 2016-06-16 LAB — LACTIC ACID, PLASMA: Lactic Acid, Venous: 1.6 mmol/L (ref 0.5–1.9)

## 2016-06-16 LAB — TROPONIN I: Troponin I: <0.03 ng/mL (ref <0.03)

## 2016-06-16 LAB — TSH: TSH: 4.474 u[IU]/mL (ref 0.350–4.500)

## 2016-06-16 LAB — AMMONIA: Ammonia: 25 umol/L (ref 9–35)

## 2016-06-16 MED ORDER — OXYCODONE HCL 5 MG PO TABS
5.0000 mg | ORAL_TABLET | Freq: Four times a day (QID) | ORAL | Status: DC | PRN
Start: 2016-06-16 — End: 2016-06-17
  Administered 2016-06-17: 08:00:00 5 mg via ORAL
  Filled 2016-06-16: qty 1

## 2016-06-16 MED ORDER — COLCHICINE 0.6 MG PO TABS
0.6000 mg | ORAL_TABLET | Freq: Two times a day (BID) | ORAL | Status: DC
  Administered 2016-06-17: 0.6 mg via ORAL
  Filled 2016-06-16: qty 1

## 2016-06-16 MED ORDER — OXYCODONE-ACETAMINOPHEN 10-325 MG PO TABS: 1.0000 | ORAL_TABLET | Freq: Four times a day (QID) | ORAL | Status: DC | PRN

## 2016-06-16 MED ORDER — ACETAMINOPHEN 325 MG PO TABS: 650.0000 mg | ORAL_TABLET | Freq: Four times a day (QID) | ORAL | Status: DC | PRN

## 2016-06-16 MED ORDER — GLIPIZIDE ER 10 MG PO TB24: 20.0000 mg | ORAL_TABLET | Freq: Every day | ORAL | Status: DC

## 2016-06-16 MED ORDER — CLOPIDOGREL BISULFATE 75 MG PO TABS
75.0000 mg | ORAL_TABLET | Freq: Every day | ORAL | Status: DC
  Administered 2016-06-17: 75 mg via ORAL
  Filled 2016-06-16: qty 1

## 2016-06-16 MED ORDER — FENTANYL 50 MCG/HR TD PT72
50.0000 ug | MEDICATED_PATCH | TRANSDERMAL | Status: DC
  Administered 2016-06-16: 50 ug via TRANSDERMAL
  Filled 2016-06-16: qty 1

## 2016-06-16 MED ORDER — METFORMIN HCL 500 MG PO TABS
500.0000 mg | ORAL_TABLET | Freq: Two times a day (BID) | ORAL | Status: DC
  Administered 2016-06-17: 500 mg via ORAL
  Filled 2016-06-16: qty 1

## 2016-06-16 MED ORDER — ONDANSETRON HCL 4 MG PO TABS: 4.0000 mg | ORAL_TABLET | Freq: Four times a day (QID) | ORAL | Status: DC | PRN

## 2016-06-16 MED ORDER — POTASSIUM CHLORIDE CRYS ER 20 MEQ PO TBCR
20.0000 meq | EXTENDED_RELEASE_TABLET | Freq: Every day | ORAL | Status: DC
  Administered 2016-06-17: 20 meq via ORAL
  Filled 2016-06-16: qty 1

## 2016-06-16 MED ORDER — OXYCODONE-ACETAMINOPHEN 5-325 MG PO TABS
1.0000 | ORAL_TABLET | Freq: Four times a day (QID) | ORAL | Status: DC | PRN
Start: 2016-06-16 — End: 2016-06-17
  Administered 2016-06-16 – 2016-06-17 (×2): 1 via ORAL
  Filled 2016-06-16 (×2): qty 1

## 2016-06-16 MED ORDER — INSULIN GLARGINE 100 UNIT/ML ~~LOC~~ SOLN
50.0000 [IU] | Freq: Every day | SUBCUTANEOUS | Status: DC
  Administered 2016-06-16: 50 [IU] via SUBCUTANEOUS
  Filled 2016-06-16 (×2): qty 0.5

## 2016-06-16 MED ORDER — INSULIN ASPART 100 UNIT/ML ~~LOC~~ SOLN
0.0000 [IU] | Freq: Three times a day (TID) | SUBCUTANEOUS | Status: DC
  Administered 2016-06-17: 3 [IU] via SUBCUTANEOUS
  Administered 2016-06-17: 2 [IU] via SUBCUTANEOUS
  Filled 2016-06-16: qty 5
  Filled 2016-06-16: qty 2

## 2016-06-16 MED ORDER — ENOXAPARIN SODIUM 40 MG/0.4ML ~~LOC~~ SOLN
40.0000 mg | SUBCUTANEOUS | Status: DC
  Administered 2016-06-16: 21:00:00 40 mg via SUBCUTANEOUS
  Filled 2016-06-16: qty 0.4

## 2016-06-16 MED ORDER — SODIUM CHLORIDE 0.9 % IV SOLN
1.0000 g | Freq: Once | INTRAVENOUS | Status: AC
  Administered 2016-06-17: 01:00:00 1 g via INTRAVENOUS
  Filled 2016-06-16: qty 10

## 2016-06-16 MED ORDER — ONDANSETRON HCL 4 MG/2ML IJ SOLN: 4.0000 mg | Freq: Four times a day (QID) | INTRAMUSCULAR | Status: DC | PRN

## 2016-06-16 MED ORDER — ASPIRIN EC 81 MG PO TBEC
81.0000 mg | DELAYED_RELEASE_TABLET | Freq: Every day | ORAL | Status: DC
  Administered 2016-06-17: 81 mg via ORAL
  Filled 2016-06-16: qty 1

## 2016-06-16 MED ORDER — AMITRIPTYLINE HCL 25 MG PO TABS
150.0000 mg | ORAL_TABLET | Freq: Every day | ORAL | Status: DC
  Administered 2016-06-17: 150 mg via ORAL
  Filled 2016-06-16: qty 6

## 2016-06-16 MED ORDER — PREGABALIN 50 MG PO CAPS
100.0000 mg | ORAL_CAPSULE | Freq: Three times a day (TID) | ORAL | Status: DC
  Administered 2016-06-16 – 2016-06-17 (×2): 100 mg via ORAL
  Filled 2016-06-16 (×2): qty 2

## 2016-06-16 MED ORDER — PANTOPRAZOLE SODIUM 40 MG PO TBEC
40.0000 mg | DELAYED_RELEASE_TABLET | Freq: Every day | ORAL | Status: DC
  Administered 2016-06-17: 40 mg via ORAL
  Filled 2016-06-16: qty 1

## 2016-06-16 MED ORDER — ACETAMINOPHEN 650 MG RE SUPP: 650.0000 mg | Freq: Four times a day (QID) | RECTAL | Status: DC | PRN

## 2016-06-16 MED ORDER — CARVEDILOL 12.5 MG PO TABS
12.5000 mg | ORAL_TABLET | Freq: Two times a day (BID) | ORAL | Status: DC
  Administered 2016-06-16 – 2016-06-17 (×2): 12.5 mg via ORAL
  Filled 2016-06-16 (×2): qty 1

## 2016-06-16 MED ORDER — ATORVASTATIN CALCIUM 20 MG PO TABS
80.0000 mg | ORAL_TABLET | Freq: Every day | ORAL | Status: DC
  Administered 2016-06-17: 11:00:00 80 mg via ORAL
  Filled 2016-06-16: qty 4

## 2016-06-16 MED ORDER — MORPHINE SULFATE (PF) 4 MG/ML IV SOLN
4.0000 mg | Freq: Once | INTRAVENOUS | Status: AC
  Administered 2016-06-16: 4 mg via INTRAVENOUS
  Filled 2016-06-16: qty 1

## 2016-06-16 MED ORDER — FUROSEMIDE 40 MG PO TABS
40.0000 mg | ORAL_TABLET | Freq: Two times a day (BID) | ORAL | Status: DC
  Administered 2016-06-16 – 2016-06-17 (×2): 40 mg via ORAL
  Filled 2016-06-16 (×2): qty 1

## 2016-06-16 MED ORDER — GEMFIBROZIL 600 MG PO TABS
600.0000 mg | ORAL_TABLET | Freq: Two times a day (BID) | ORAL | Status: DC
  Filled 2016-06-16: qty 1

## 2016-06-16 MED ORDER — ZOLPIDEM TARTRATE 5 MG PO TABS
10.0000 mg | ORAL_TABLET | Freq: Every evening | ORAL | Status: DC | PRN
Start: 2016-06-16 — End: 2016-06-17
  Administered 2016-06-16: 10 mg via ORAL
  Filled 2016-06-16: qty 2

## 2016-06-16 NOTE — ED Provider Notes (Signed)
[facility] Emergency Department Provider Note   ____________________________________________   First MD Initiated Contact with Patient 06/16/16 1204     (approximate)  I have reviewed the triage vital signs and the nursing notes.   HISTORY  Chief Complaint Fall  EM caveat: The patient is confused, history felt unreliable  HPI Mark Armstrong is a 66 y.o. male who reportedly called 911 after falling.The patient reports that he was outside and tripped over a "a rock" falling on his shoulder though he can't describe which one. He thinks he may have at the back of his head.  When asked about chest pain, patient reports that he doesn't remember having any chest pain and he is not having any now.  I discussed with the patient's wife and daughter who arrived later, the report that he has fallen twice now in the last 24 hours and that he sometimes becomes confused, and he seems slightly confused today as well. He fell in the house, he did not fall on a rock outside, and they suspect he may have tripped over his dog but no one saw it happen. They found him fallen forward into a table but there was no report of loss of consciousness. He heard him fall.  Family reports he's been is fairly normal state of health, but was admitted for something somewhat similar a few months ago where he was falling and confused.   Past Medical History:  Diagnosis Date  . 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 CHF (congestive heart failure) (HCC)   . Diabetes mellitus without complication (HCC)   . Glaucoma   . Hypertension   . Stroke [facility]     Patient Active Problem List   Diagnosis Date Noted  . Frequent falls 06/16/2016  . Acute delirium 04/18/2016  . Chronic pain syndrome 04/18/2016  . Cellulitis 12/20/2015    Past Surgical History:  Procedure Laterality Date  . CARDIAC SURGERY    . CORONARY ARTERY BYPASS GRAFT    . EYE SURGERY    . INCISION AND DRAINAGE WOUND WITH  TENDON REPAIR Left 12/27/2015   Procedure: INCISION AND DRAINAGE WOUND WITH TENDON REPAIR;  Surgeon: Kennedy Bucker, MD;  Location: [facility] ORS;  Service: Orthopedics;  Laterality: Left;  . Right foot surgery    . Right knee arthroscopy    . TOE AMPUTATION      Prior to Admission medications   Medication Sig Start Date End Date Taking? Authorizing Provider  amitriptyline (ELAVIL) 150 MG tablet Take 150 mg by mouth daily.   Yes Historical Provider, MD  aspirin 81 MG tablet Take 81 mg by mouth daily. 05/03/04  Yes Historical Provider, MD  atorvastatin (LIPITOR) 80 MG tablet Take 80 mg by mouth daily. 12/11/15  Yes Historical Provider, MD  carvedilol (COREG) 12.5 MG tablet Take 12.5 mg by mouth 2 (two) times daily.  12/11/15  Yes Historical Provider, MD  colchicine 0.6 MG tablet Take 0.6 mg by mouth 2 (two) times daily. 11/08/15  Yes Historical Provider, MD  fentaNYL (DURAGESIC - DOSED MCG/HR) 50 MCG/HR Place 1 patch onto the skin every 3 (three) days. 05/26/16  Yes Historical Provider, MD  insulin glargine (LANTUS) 100 UNIT/ML injection Inject 55-60 Units into the skin at bedtime.  01/12/14  Yes Historical Provider, MD  oxyCODONE-acetaminophen (PERCOCET) 10-325 MG tablet Take 1 tablet by mouth every 6 (six) hours as needed. for pain 05/26/16  Yes Historical Provider, MD  pregabalin (LYRICA) 100 MG capsule Take  100 mg by mouth 3 (three) times daily. 05/02/13  Yes Historical Provider, MD  zolpidem (AMBIEN) 10 MG tablet Take 10 mg by mouth at bedtime as needed for sleep.    Yes Historical Provider, MD  zolpidem (AMBIEN) 5 MG tablet Take 2.5 mg by mouth at bedtime as needed. 06/06/16  Yes Historical Provider, MD  clopidogrel (PLAVIX) 75 MG tablet Take 75 mg by mouth daily. 08/08/15 08/07/16  Historical Provider, MD  esomeprazole (NEXIUM) 40 MG capsule Take 40 mg by mouth daily. 11/08/13   Historical Provider, MD  furosemide (LASIX) 40 MG tablet Take 40 mg by mouth 2 (two) times daily. 09/23/15   Historical Provider, MD    gemfibrozil (LOPID) 600 MG tablet Take 600 mg by mouth 2 (two) times daily. 11/03/13   Historical Provider, MD  glipiZIDE (GLUCOTROL XL) 10 MG 24 hr tablet Take 20 mg by mouth daily. 11/08/13   Historical Provider, MD  HYDROcodone-acetaminophen (NORCO) 5-325 MG tablet Take 1 tablet by mouth every 6 (six) hours as needed for moderate pain. Patient not taking: Reported on 04/18/2016 12/27/15   Kennedy Bucker, MD  metFORMIN (GLUCOPHAGE) 500 MG tablet Take 500 mg by mouth 2 (two) times daily with a meal.    Historical Provider, MD  potassium chloride SA (K-DUR,KLOR-CON) 20 MEQ tablet Take 20 mEq by mouth daily.    Historical Provider, MD    Allergies Penicillins  Family History  Problem Relation Age of Onset  . CAD Mother   . Diabetes Mother   . CAD Father   . Diabetes Father     Social History Social History  Substance Use Topics  . Smoking status: Former Smoker    Packs/day: 0.75    Years: 2.00    Types: Cigarettes  . Smokeless tobacco: Never Used  . Alcohol use No    Review of Systems - asked of the patient but a caveat is noted as he does appear somewhat confused Constitutional: No fever/chills   Cardiovascular: Denies chest pain. Respiratory: Denies shortness of breath. Gastrointestinal: No abdominal pain.  No nausea, no vomiting.  No diarrhea.  No constipation. Musculoskeletal: Negative for back pain support he reports is a chronic pain in his lower back that makes it hard for him to sit in a bed. He is requesting to sit in a chair, and was provided one here. Skin: Negative for rash. Neurological: Negative for headaches, focal weakness or numbness.  ______________________   PHYSICAL EXAM:  VITAL SIGNS: ED Triage Vitals  Enc Vitals Group     BP 06/16/16 1214 100/74     Pulse Rate 06/16/16 1201 (!) 108     Resp 06/16/16 1201 18     Temp --      Temp src --      SpO2 06/16/16 1201 97 %     Weight 06/16/16 1201 219 lb (99.3 kg)     Height 06/16/16 1201 6\' 1"  (1.854  m)     Head Circumference --      Peak Flow --      Pain Score 06/16/16 1201 4     Pain Loc --      Pain Edu? --      Excl. in GC? --     Constitutional: Alert and orientedBut appears somewhat fatigued and confused. He does not answer questions consistently, he does ask her and answers orientation questions well but cannot seem to recall some things and in some affect almost seems to be confabulating or otherwise  confused. In no acute distress Eyes: Conjunctivae are normal. PERRL. EOMI. Head: Atraumatic. Nose: No congestion/rhinnorhea. Mouth/Throat: Mucous membranes are moist.  Oropharynx non-erythematous. Neck: No stridor.  No cervical tenderness Cardiovascular: Normal rate, regular rhythm. Grossly normal heart sounds.  Good peripheral circulation. Respiratory: Normal respiratory effort.  No retractions. Lungs CTAB. Gastrointestinal: Soft and nontender. No distention.  Musculoskeletal: No lower extremity tenderness though there is moderate edema primarily involving the right greater than left leg, which the patient reports is something "he's had a while" with some mild erythema noted across the anterior right shin.  No joint effusions. Neurologic:  Normal speech and language. No gross focal neurologic deficits are appreciated.  Skin:  Skin is warm, dry and intact. No rash noted. Psychiatric: Mood and affect are slightly elevated, seems minimally agitated and somewhat delirious ____________________________________________   LABS (all labs ordered are listed, but only abnormal results are displayed)  Labs Reviewed  BASIC METABOLIC PANEL - Abnormal; Notable for the following:       Result Value   Glucose, Bld 282 (*)    All other components within normal limits  CBC - Abnormal; Notable for the following:    RDW 15.4 (*)    All other components within normal limits  URINALYSIS, COMPLETE (UACMP) WITH MICROSCOPIC - Abnormal; Notable for the following:    Color, Urine YELLOW (*)     APPearance CLEAR (*)    Glucose, UA >=500 (*)    All other components within normal limits  TROPONIN I  AMMONIA  LACTIC ACID, PLASMA  TSH  LACTIC ACID, PLASMA   ____________________________________________  EKG  Reviewed and interpreted by me at 1240 Heart rate 105 QRS 120 QTc 460 Sinus tachycardia, left bundle branch block, not meeting criteria for [place] of [place] ____________________________________________  RADIOLOGY  Dg Chest 2 View  Result Date: 06/16/2016 CLINICAL DATA:  Chest pain. EXAM: CHEST  2 VIEW COMPARISON:  04/18/2016 and prior exam FINDINGS: Cardiomegaly and CABG changes again noted. Mild peribronchial thickening is unchanged. There is no evidence of focal airspace disease, pulmonary edema, suspicious pulmonary nodule/mass, pleural effusion, or pneumothorax. No acute bony abnormalities are identified. IMPRESSION: Cardiomegaly without evidence of acute cardiopulmonary disease. Electronically Signed   By: Harmon PierJeffrey  Hu M.D.   On: 06/16/2016 13:26   Dg Lumbar Spine 2-3 Views  Result Date: 06/16/2016 CLINICAL DATA:  Low back and lumbar spine pain following fall. Initial encounter. EXAM: LUMBAR SPINE - 2-3 VIEW COMPARISON:  03/10/2015 and prior exams FINDINGS: There is no evidence of acute fracture or subluxation. Mild superior endplate compression fracture of L1 is unchanged. Mild multilevel degenerative disc disease again identified. Right renal calculi are again identified. IMPRESSION: No evidence of acute abnormality. Unchanged mild L1 superior endplate compression fracture and mild multilevel degenerative disc disease. Electronically Signed   By: Harmon PierJeffrey  Hu M.D.   On: 06/16/2016 13:28   Ct Head Wo Contrast  Result Date: 06/16/2016 CLINICAL DATA:  Status post falling forward. EXAM: CT HEAD WITHOUT CONTRAST CT CERVICAL SPINE WITHOUT CONTRAST TECHNIQUE: Multidetector CT imaging of the head and cervical spine was performed following the standard protocol without intravenous  contrast. Multiplanar CT image reconstructions of the cervical spine were also generated. COMPARISON:  04/18/2016 FINDINGS: CT HEAD FINDINGS Brain: No evidence of acute infarction, hemorrhage, hydrocephalus, extra-axial collection or mass lesion/mass effect. Mild brain parenchymal volume loss. Vascular: Calcific atherosclerotic disease at the skullbase. Skull: Choose were Sinuses/Orbits: Polypoid mucosal thickening of the right maxillary sinus. Sinuses and mastoid air cells otherwise well  aerated. Other: Left orbit postsurgical changes. CT CERVICAL SPINE FINDINGS Alignment: Normal. Skull base and vertebrae: No acute fracture. No primary bone lesion or focal pathologic process. Soft tissues and spinal canal: No prevertebral fluid or swelling. No visible canal hematoma. Disc levels: Mild multilevel osteoarthritic changes of the cervical spine. Upper chest: 5 mm soft tissue pulmonary nodule in the right apex. Other: None. IMPRESSION: No evidence of acute intracranial abnormality. Mild brain parenchymal volume loss. No evidence of acute traumatic injury to cervical spine. Right maxillary chronic sinusitis. 5 mm soft tissue pulmonary nodules in the right apex. No follow-up needed if patient is low-risk. Non-contrast chest CT can be considered in 12 months if patient is high-risk. This recommendation follows the consensus statement: Guidelines for Management of Incidental Pulmonary Nodules Detected on CT Images: From the Fleischner Society 2017; Radiology 2017; 284:228-243. Electronically Signed   By: Ted Mcalpine M.D.   On: 06/16/2016 13:26   Ct Cervical Spine Wo Contrast  Result Date: 06/16/2016 CLINICAL DATA:  Status post falling forward. EXAM: CT HEAD WITHOUT CONTRAST CT CERVICAL SPINE WITHOUT CONTRAST TECHNIQUE: Multidetector CT imaging of the head and cervical spine was performed following the standard protocol without intravenous contrast. Multiplanar CT image reconstructions of the cervical spine were  also generated. COMPARISON:  04/18/2016 FINDINGS: CT HEAD FINDINGS Brain: No evidence of acute infarction, hemorrhage, hydrocephalus, extra-axial collection or mass lesion/mass effect. Mild brain parenchymal volume loss. Vascular: Calcific atherosclerotic disease at the skullbase. Skull: Choose were Sinuses/Orbits: Polypoid mucosal thickening of the right maxillary sinus. Sinuses and mastoid air cells otherwise well aerated. Other: Left orbit postsurgical changes. CT CERVICAL SPINE FINDINGS Alignment: Normal. Skull base and vertebrae: No acute fracture. No primary bone lesion or focal pathologic process. Soft tissues and spinal canal: No prevertebral fluid or swelling. No visible canal hematoma. Disc levels: Mild multilevel osteoarthritic changes of the cervical spine. Upper chest: 5 mm soft tissue pulmonary nodule in the right apex. Other: None. IMPRESSION: No evidence of acute intracranial abnormality. Mild brain parenchymal volume loss. No evidence of acute traumatic injury to cervical spine. Right maxillary chronic sinusitis. 5 mm soft tissue pulmonary nodules in the right apex. No follow-up needed if patient is low-risk. Non-contrast chest CT can be considered in 12 months if patient is high-risk. This recommendation follows the consensus statement: Guidelines for Management of Incidental Pulmonary Nodules Detected on CT Images: From the Fleischner Society 2017; Radiology 2017; 284:228-243. Electronically Signed   By: Ted Mcalpine M.D.   On: 06/16/2016 13:26    ____________________________________________   PROCEDURES  Procedure(s) performed: None  Procedures  Critical Care performed: No  ____________________________________________   INITIAL IMPRESSION / ASSESSMENT AND PLAN / ED COURSE  Pertinent labs & imaging results that were available during my care of the patient were reviewed by me and considered in my medical decision making (see chart for details).  Patient presents after  fall. Notably somewhat confused, slightly agitated times off-and-on concerning for possible delirium. Similar presentation a few months ago 5 possibly due to polypharmacy and associated cellulitis.  We'll evaluate with CT scan of the head and neck, x-ray no no clear or obvious evidence of trauma by clinical exam but history does suggest a fall. Overall hemodynamic stable, but slightly tachycardia and confusion are notable.  ----------------------------------------- 4:08 PM on 06/16/2016 -----------------------------------------  Patient being admitted. He is occasionally agitated, becomes upset very easily, and continues to have confusion. No significant worsening however, that based on his altered mental status discussed with the patient and  his wife, the patient does not appear to have capacity at present to make medical decisions given his associated confusion, and the patient's wife indicates agreement with a plan for admission under observation and further workup under the hospitalist service for what appears to me as possible delirium.        ____________________________________________   FINAL CLINICAL IMPRESSION(S) / ED DIAGNOSES  Final diagnoses:  Delirium  Polypharmacy      NEW MEDICATIONS STARTED DURING THIS VISIT:  New Prescriptions   No medications on file     Note:  This document was prepared using Dragon voice recognition software and may include unintentional dictation errors.     Sharyn Creamer, MD 06/16/16 [phone]

## 2016-06-16 NOTE — ED Triage Notes (Signed)
BIB EMS from home post fall EMS reports he was playing with dogs then fell forward. Hx of frequent falls. Pt denies LOC. Multiple abrasions on arms and legs. Pt reports Chest pain that started today and radiates down left arm. Denies SOB

## 2016-06-16 NOTE — H&P (Signed)
[employer] - [place] at [facility]   PATIENT NAME: Mark Armstrong    MR#:  [id number]  DATE OF BIRTH:  08/02/1950  DATE OF ADMISSION:  06/16/2016  PRIMARY CARE PHYSICIAN: Sharilyn SitesMark Heffington, MD   REQUESTING/REFERRING PHYSICIAN: Dr. Sharyn CreamerMark Quale  CHIEF COMPLAINT:   Chief Complaint  Patient presents with  . Chest Pain  . Fall    HISTORY OF PRESENT ILLNESS:  Mark Armstrong  is a 66 y.o. male with a known history of Charcot-Marie-Tooth disease, history of CHF, diabetes, hypertension, glaucoma, history of previous CVA, chronic pain syndrome who presents to the hospital due to a little and back pain. As per the wife patient was also somewhat confused but his mental status since then has improved. Patient's wife says that he fell today at home his head on the table at home. Patient does not recall the events completely. There has been no change in any of his medications. Patient denies any headache, nausea, vomiting, chest pain, shortness of breath. He does admit to back pain which is chronic for him. As per the wife and the daughter at bedside they do think that the patient has been more weak and has been falling frequently at home over the past year or so. Patient currently does not have any home health services. Given his ongoing back pain and frequent falls hospitalist services were contacted further treatment and evaluation.  PAST MEDICAL HISTORY:   Past Medical History:  Diagnosis Date  . 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 CHF (congestive heart failure) (HCC)   . Diabetes mellitus without complication (HCC)   . Glaucoma   . Hypertension   . Stroke [facility](HCC)     PAST SURGICAL HISTORY:   Past Surgical History:  Procedure Laterality Date  . CARDIAC SURGERY    . CORONARY ARTERY BYPASS GRAFT    . EYE SURGERY    . INCISION AND DRAINAGE WOUND WITH TENDON REPAIR Left 12/27/2015   Procedure: INCISION AND DRAINAGE WOUND WITH TENDON REPAIR;  Surgeon: Kennedy BuckerMichael Menz, MD;  Location:  [facility] ORS;  Service: Orthopedics;  Laterality: Left;  . Right foot surgery    . Right knee arthroscopy    . TOE AMPUTATION      SOCIAL HISTORY:   Social History  Substance Use Topics  . Smoking status: Former Smoker    Packs/day: 0.75    Years: 2.00    Types: Cigarettes  . Smokeless tobacco: Never Used  . Alcohol use No    FAMILY HISTORY:   Family History  Problem Relation Age of Onset  . CAD Mother   . Diabetes Mother   . CAD Father   . Diabetes Father     DRUG ALLERGIES:   Allergies  Allergen Reactions  . Penicillins Rash    REVIEW OF SYSTEMS:   Review of Systems  Constitutional: Negative for fever and weight loss.  HENT: Negative for congestion, nosebleeds and tinnitus.   Eyes: Negative for blurred vision, double vision and redness.  Respiratory: Negative for cough, hemoptysis and shortness of breath.   Cardiovascular: Negative for chest pain, orthopnea, leg swelling and PND.  Gastrointestinal: Negative for abdominal pain, diarrhea, melena, nausea and vomiting.  Genitourinary: Negative for dysuria, hematuria and urgency.  Musculoskeletal: Positive for back pain and falls. Negative for joint pain.  Neurological: Negative for dizziness, tingling, sensory change, focal weakness, seizures, weakness and headaches.  Endo/Heme/Allergies: Negative for polydipsia. Does not bruise/bleed easily.  Psychiatric/Behavioral: Negative for depression and memory loss. The  patient is not nervous/anxious.     MEDICATIONS AT HOME:   Prior to Admission medications   Medication Sig Start Date End Date Taking? Authorizing Provider  amitriptyline (ELAVIL) 150 MG tablet Take 150 mg by mouth daily.   Yes Historical Provider, MD  aspirin 81 MG tablet Take 81 mg by mouth daily. 05/03/04  Yes Historical Provider, MD  atorvastatin (LIPITOR) 80 MG tablet Take 80 mg by mouth daily. 12/11/15  Yes Historical Provider, MD  carvedilol (COREG) 12.5 MG tablet Take 12.5 mg by mouth 2 (two) times  daily.  12/11/15  Yes Historical Provider, MD  colchicine 0.6 MG tablet Take 0.6 mg by mouth 2 (two) times daily. 11/08/15  Yes Historical Provider, MD  fentaNYL (DURAGESIC - DOSED MCG/HR) 50 MCG/HR Place 1 patch onto the skin every 3 (three) days. 05/26/16  Yes Historical Provider, MD  insulin glargine (LANTUS) 100 UNIT/ML injection Inject 55-60 Units into the skin at bedtime.  01/12/14  Yes Historical Provider, MD  oxyCODONE-acetaminophen (PERCOCET) 10-325 MG tablet Take 1 tablet by mouth every 6 (six) hours as needed. for pain 05/26/16  Yes Historical Provider, MD  pregabalin (LYRICA) 100 MG capsule Take 100 mg by mouth 3 (three) times daily. 05/02/13  Yes Historical Provider, MD  zolpidem (AMBIEN) 10 MG tablet Take 10 mg by mouth at bedtime as needed for sleep.    Yes Historical Provider, MD  zolpidem (AMBIEN) 5 MG tablet Take 2.5 mg by mouth at bedtime as needed. 06/06/16  Yes Historical Provider, MD  clopidogrel (PLAVIX) 75 MG tablet Take 75 mg by mouth daily. 08/08/15 08/07/16  Historical Provider, MD  esomeprazole (NEXIUM) 40 MG capsule Take 40 mg by mouth daily. 11/08/13   Historical Provider, MD  furosemide (LASIX) 40 MG tablet Take 40 mg by mouth 2 (two) times daily. 09/23/15   Historical Provider, MD  gemfibrozil (LOPID) 600 MG tablet Take 600 mg by mouth 2 (two) times daily. 11/03/13   Historical Provider, MD  glipiZIDE (GLUCOTROL XL) 10 MG 24 hr tablet Take 20 mg by mouth daily. 11/08/13   Historical Provider, MD  HYDROcodone-acetaminophen (NORCO) 5-325 MG tablet Take 1 tablet by mouth every 6 (six) hours as needed for moderate pain. Patient not taking: Reported on 04/18/2016 12/27/15   Kennedy Bucker, MD  metFORMIN (GLUCOPHAGE) 500 MG tablet Take 500 mg by mouth 2 (two) times daily with a meal.    Historical Provider, MD  potassium chloride SA (K-DUR,KLOR-CON) 20 MEQ tablet Take 20 mEq by mouth daily.    Historical Provider, MD      VITAL SIGNS:  Blood pressure 112/65, pulse (!) 104, resp. rate 18,  height 6\' 1"  (1.854 m), weight 99.3 kg (219 lb), SpO2 95 %.  PHYSICAL EXAMINATION:  Physical Exam  GENERAL:  66 y.o.-year-old patient lying in bed in no acute distress.  EYES: Pupils equal, round, reactive to light and accommodation. No scleral icterus. Extraocular muscles intact.  HEENT: Head atraumatic, normocephalic. Oropharynx and nasopharynx clear. No oropharyngeal erythema, moist oral mucosa  NECK:  Supple, no jugular venous distention. No thyroid enlargement, no tenderness.  LUNGS: Normal breath sounds bilaterally, no wheezing, rales, rhonchi. No use of accessory muscles of respiration.  CARDIOVASCULAR: S1, S2 RRR. No murmurs, rubs, gallops, clicks.  ABDOMEN: Soft, nontender, nondistended. Bowel sounds present. No organomegaly or mass.  EXTREMITIES: +1 pedal edema b/l, cyanosis, or clubbing. + 2 pedal & radial pulses b/l.  Signs of chronic venous stasis on lower extremities bilaterally. NEUROLOGIC: Cranial nerves II through  XII are intact. No focal Motor or sensory deficits appreciated b/l.  Globally weak.  PSYCHIATRIC: The patient is alert and oriented x 3. Good affect.  SKIN: No obvious rash, lesion, or ulcer.   LABORATORY PANEL:   CBC  Recent Labs Lab 06/16/16 1215  WBC 6.2  HGB 14.6  HCT 41.1  PLT 150   ------------------------------------------------------------------------------------------------------------------  Chemistries   Recent Labs Lab 06/16/16 1215  NA 135  K 4.3  CL 102  CO2 27  GLUCOSE 282*  BUN 20  CREATININE 1.07  CALCIUM 9.1   ------------------------------------------------------------------------------------------------------------------  Cardiac Enzymes  Recent Labs Lab 06/16/16 1215  TROPONINI <0.03   ------------------------------------------------------------------------------------------------------------------  RADIOLOGY:  Dg Chest 2 View  Result Date: 06/16/2016 CLINICAL DATA:  Chest pain. EXAM: CHEST  2 VIEW COMPARISON:   04/18/2016 and prior exam FINDINGS: Cardiomegaly and CABG changes again noted. Mild peribronchial thickening is unchanged. There is no evidence of focal airspace disease, pulmonary edema, suspicious pulmonary nodule/mass, pleural effusion, or pneumothorax. No acute bony abnormalities are identified. IMPRESSION: Cardiomegaly without evidence of acute cardiopulmonary disease. Electronically Signed   By: Harmon Pier M.D.   On: 06/16/2016 13:26   Dg Lumbar Spine 2-3 Views  Result Date: 06/16/2016 CLINICAL DATA:  Low back and lumbar spine pain following fall. Initial encounter. EXAM: LUMBAR SPINE - 2-3 VIEW COMPARISON:  03/10/2015 and prior exams FINDINGS: There is no evidence of acute fracture or subluxation. Mild superior endplate compression fracture of L1 is unchanged. Mild multilevel degenerative disc disease again identified. Right renal calculi are again identified. IMPRESSION: No evidence of acute abnormality. Unchanged mild L1 superior endplate compression fracture and mild multilevel degenerative disc disease. Electronically Signed   By: Harmon Pier M.D.   On: 06/16/2016 13:28   Ct Head Wo Contrast  Result Date: 06/16/2016 CLINICAL DATA:  Status post falling forward. EXAM: CT HEAD WITHOUT CONTRAST CT CERVICAL SPINE WITHOUT CONTRAST TECHNIQUE: Multidetector CT imaging of the head and cervical spine was performed following the standard protocol without intravenous contrast. Multiplanar CT image reconstructions of the cervical spine were also generated. COMPARISON:  04/18/2016 FINDINGS: CT HEAD FINDINGS Brain: No evidence of acute infarction, hemorrhage, hydrocephalus, extra-axial collection or mass lesion/mass effect. Mild brain parenchymal volume loss. Vascular: Calcific atherosclerotic disease at the skullbase. Skull: Choose were Sinuses/Orbits: Polypoid mucosal thickening of the right maxillary sinus. Sinuses and mastoid air cells otherwise well aerated. Other: Left orbit postsurgical changes. CT  CERVICAL SPINE FINDINGS Alignment: Normal. Skull base and vertebrae: No acute fracture. No primary bone lesion or focal pathologic process. Soft tissues and spinal canal: No prevertebral fluid or swelling. No visible canal hematoma. Disc levels: Mild multilevel osteoarthritic changes of the cervical spine. Upper chest: 5 mm soft tissue pulmonary nodule in the right apex. Other: None. IMPRESSION: No evidence of acute intracranial abnormality. Mild brain parenchymal volume loss. No evidence of acute traumatic injury to cervical spine. Right maxillary chronic sinusitis. 5 mm soft tissue pulmonary nodules in the right apex. No follow-up needed if patient is low-risk. Non-contrast chest CT can be considered in 12 months if patient is high-risk. This recommendation follows the consensus statement: Guidelines for Management of Incidental Pulmonary Nodules Detected on CT Images: From the Fleischner Society 2017; Radiology 2017; 284:228-243. Electronically Signed   By: Ted Mcalpine M.D.   On: 06/16/2016 13:26   Ct Cervical Spine Wo Contrast  Result Date: 06/16/2016 CLINICAL DATA:  Status post falling forward. EXAM: CT HEAD WITHOUT CONTRAST CT CERVICAL SPINE WITHOUT CONTRAST TECHNIQUE: Multidetector CT imaging  of the head and cervical spine was performed following the standard protocol without intravenous contrast. Multiplanar CT image reconstructions of the cervical spine were also generated. COMPARISON:  04/18/2016 FINDINGS: CT HEAD FINDINGS Brain: No evidence of acute infarction, hemorrhage, hydrocephalus, extra-axial collection or mass lesion/mass effect. Mild brain parenchymal volume loss. Vascular: Calcific atherosclerotic disease at the skullbase. Skull: Choose were Sinuses/Orbits: Polypoid mucosal thickening of the right maxillary sinus. Sinuses and mastoid air cells otherwise well aerated. Other: Left orbit postsurgical changes. CT CERVICAL SPINE FINDINGS Alignment: Normal. Skull base and vertebrae: No  acute fracture. No primary bone lesion or focal pathologic process. Soft tissues and spinal canal: No prevertebral fluid or swelling. No visible canal hematoma. Disc levels: Mild multilevel osteoarthritic changes of the cervical spine. Upper chest: 5 mm soft tissue pulmonary nodule in the right apex. Other: None. IMPRESSION: No evidence of acute intracranial abnormality. Mild brain parenchymal volume loss. No evidence of acute traumatic injury to cervical spine. Right maxillary chronic sinusitis. 5 mm soft tissue pulmonary nodules in the right apex. No follow-up needed if patient is low-risk. Non-contrast chest CT can be considered in 12 months if patient is high-risk. This recommendation follows the consensus statement: Guidelines for Management of Incidental Pulmonary Nodules Detected on CT Images: From the Fleischner Society 2017; Radiology 2017; 284:228-243. Electronically Signed   By: Ted Mcalpine M.D.   On: 06/16/2016 13:26     IMPRESSION AND PLAN:   66 year old male with past medical history of Charcot-Marie-Tooth disease, diabetes, hypertension, chronic back pain, who presents to the hospital due to frequent falls and also back pain.   1. Frequent falls/back pain-secondary to deconditioning and also ongoing chronic pain. -Supportive care with pain control with fentanyl patch, Percocet, Lyrica, elavil.  - will get PT eval as pt. May benefit from Home health services.   2. DM Type II - continue metformin, Glipizide,  Lantus and SSI and follow bS.  - place on Carb control diet.   3. Hyperlipidemia - cont. Cont. Atorvastatin  4. GERD - cont. Nexium.   5. Hx of Gout - cont. Colchicine.   6. Chronic back pain - cont. Fentanyl patch, Norco, Elavil.    All the records are reviewed and case discussed with ED provider. Management plans discussed with the patient, family and they are in agreement.  CODE STATUS: Full code  TOTAL TIME TAKING CARE OF THIS PATIENT: 45 minutes.     Houston Siren M.D on 06/16/2016 at 3:37 PM  Between 7am to 6pm - Pager - [phone]  After 6pm go to [email or web address] - password [email or web address] [facility]  [facility]  Office  [phone]  CC: Primary care physician; Sharilyn Sites, MD

## 2016-06-16 NOTE — ED Notes (Signed)
Linen changed and pt placed in yellow socks. Pt very restless to get out of bed.Family and pt updated about current status of admit bed assignment.

## 2016-06-16 NOTE — ED Notes (Signed)
Nurse in with pt at this time to assist with use of bedpan. Pt states he can not use bedpan at this time. Wife a bedside. Pt trying to slide to end of bed. Pt requesting to let side rails down. Pt informed that we are concerned for him falling so the side rails will have to remain up. FAmily at bedside. Call bell in reach. Instructed to notify nurse if they leave. Family informed that nurse does not want to have pt getting up on his own.

## 2016-06-16 NOTE — ED Notes (Signed)
Pt moving in bed, unable to get EKG. Moving pt to recliner and will try again

## 2016-06-17 LAB — GLUCOSE, CAPILLARY
GLUCOSE-CAPILLARY: 213 mg/dL — AB (ref 65–99)
GLUCOSE-CAPILLARY: 199 mg/dL — AB (ref 65–99)

## 2016-06-17 MED ORDER — ACETAMINOPHEN 325 MG PO TABS: 650.0000 mg | ORAL_TABLET | Freq: Four times a day (QID) | ORAL | Status: AC | PRN

## 2016-06-17 MED ORDER — FENTANYL 50 MCG/HR TD PT72: 50.0000 ug | MEDICATED_PATCH | TRANSDERMAL | 0 refills | Status: AC

## 2016-06-17 MED ORDER — HYDROCODONE-ACETAMINOPHEN 5-325 MG PO TABS: 1.0000 | ORAL_TABLET | Freq: Four times a day (QID) | ORAL | 0 refills | Status: AC | PRN

## 2016-06-17 NOTE — Care Management (Signed)
Admitted to [facility] nder observation status with the diagnosis of frequent falls. Lives with wife, Clotilde DieterRosa [phone]). Last seen Dr. Marolyn HallerHeffington a month ago. Home Health 8-9 years ago. Doesn't remember name of agency. No skilled Nursing. No home oxygen. Research scientist (medical)Wheelchair and rollayor in the home., Larey SeatFell rior to presenting to the hospital. States he has frequent falls due to the nerve endings in his foot. "Can't feel much." Good appetite. Prescriptions are filled at [facility] in [place]. Takes care of all basic activities of daily living himself, doesn't drive, Wife does errands. Wife will transport.                                                 Gwenette GreetBrenda s Cheetara Hoge RN MSN CCM Care Management

## 2016-06-17 NOTE — Progress Notes (Signed)
Clinical Social Worker (CSW) received SNF consult. PT is recommending home health. RN case manager aware of above. Please reconsult if future social work needs arise. CSW signing off.   Lovis More, LCSW [phone] 

## 2016-06-17 NOTE — Care Management Obs Status (Signed)
MEDICARE OBSERVATION STATUS NOTIFICATION   Patient Details  Name: Mark Armstrong MRN: [id number] Date of Birth: 08/07/1950   Medicare Observation Status Notification Given:  Yes    Gwenette GreetBrenda S Onell Mcmath, RN 06/17/2016, 9:31 AM

## 2016-06-17 NOTE — Care Management (Signed)
Discharge to home today per Dr. Amado CoeGouru. Will be followed by [facility] for Nursing and physical therapy. Information faxed to [facility]. Wife will transport.              Gwenette GreetBrenda S Sharifah Champine RN MSN CCM Care Management

## 2016-06-17 NOTE — Evaluation (Signed)
Physical Therapy Evaluation Patient Details Name: Mark Armstrong MRN: [id number] DOB: 30-Jun-1950 Today's Date: 06/17/2016   History of Present Illness  Pt is a 66 y.o. male with a known history of Charcot-Marie-Tooth disease, history of CHF, diabetes, hypertension, glaucoma, history of previous CVA, chronic pain syndrome who presents to the hospital due to back pain. As per the wife patient was also somewhat confused but his mental status since then has improved. Patient's wife says that he fell today at home. Patient does not recall the events completely. There has been no change in any of his medications. Patient denies any headache, nausea, vomiting, chest pain, shortness of breath. He does admit to back pain which is chronic for him. As per the wife and the daughter at bedside they do think that the patient has been more weak and has been falling frequently at home over the past year or so.    Clinical Impression  Pt is independent with bed mobility and CGA with transfers.  Pt able to amb 30' with RW and CGA with good stability although gait is cautious with slow cadence and short B step length.  Pt reports being at baseline for functional mobility at this time.  Pt's baseline includes a reported history of frequent falls, up to 1-2 falls/week and limited household ambulation.  Pt would benefit from continued PT services to address fall history and decreased activity tolerance for decreased risk of future falls.    Follow Up Recommendations Home health PT    Equipment Recommendations  Rolling walker with 5" wheels    Recommendations for Other Services       Precautions / Restrictions Precautions Precautions: Fall Restrictions Weight Bearing Restrictions: No      Mobility  Bed Mobility Overal bed mobility: Independent                Transfers Overall transfer level: Needs assistance Equipment used: Rolling walker (2 wheeled) Transfers: Sit to/from Stand Sit to Stand: Min  guard            Ambulation/Gait Ambulation/Gait assistance: Min guard Ambulation Distance (Feet): 30 Feet Assistive device: Rolling walker (2 wheeled) Gait Pattern/deviations: Decreased step length - right;Decreased step length - left;Trunk flexed   Gait velocity interpretation: Below normal speed for age/gender General Gait Details: Pt steady with amb but cautious with slow cadence and decreased B step length  Stairs            Wheelchair Mobility    Modified Rankin (Stroke Patients Only)       Balance                                             Pertinent Vitals/Pain Pain Assessment: 0-10 Pain Score: 7  Pain Location: B feet Pain Descriptors / Indicators: Constant Pain Intervention(s): Monitored during session;Premedicated before session    Home Living Family/patient expects to be discharged to:: Private residence Living Arrangements: Spouse/significant other;Children;Other relatives (Son who is blind and 65 yo grandson) Available Help at Discharge: Family;Available 24 hours/day Type of Home: House Home Access: Ramped entrance     Home Layout: One level Home Equipment: Gilmer Morane - quad;Walker - 4 wheels      Prior Function Level of Independence: Needs assistance   Gait / Transfers Assistance Needed: Mod I amb with rollator mostly household distances with frequent falls  ADL's / Homemaking Assistance Needed:  Spouse provides assistance with ADLs as needed        Hand Dominance        Extremity/Trunk Assessment        Lower Extremity Assessment Lower Extremity Assessment: Overall WFL for tasks assessed       Communication   Communication: No difficulties  Cognition Arousal/Alertness: Awake/alert Behavior During Therapy: WFL for tasks assessed/performed Overall Cognitive Status: Within Functional Limits for tasks assessed                      General Comments      Exercises     Assessment/Plan    PT  Assessment Patient needs continued PT services  PT Problem List Decreased balance          PT Treatment Interventions DME instruction;Gait training;Stair training;Therapeutic activities;Therapeutic exercise;Balance training;Neuromuscular re-education;Patient/family education    PT Goals (Current goals can be found in the Care Plan section)  Acute Rehab PT Goals Patient Stated Goal: To return home PT Goal Formulation: With patient Time For Goal Achievement: 06/30/16 Potential to Achieve Goals: Good    Frequency Min 2X/week   Barriers to discharge        Co-evaluation               End of Session Equipment Utilized During Treatment: Gait belt Activity Tolerance: Patient tolerated treatment well Patient left: in bed;with bed alarm set;with call bell/phone within reach;with family/visitor present      Functional Assessment Tool Used: Clinical reasoning Functional Limitation: Mobility: Walking and moving around Mobility: Walking and Moving Around Current Status (G9562): At least 1 percent but less than 20 percent impaired, limited or restricted Mobility: Walking and Moving Around Goal Status [phone]): 0 percent impaired, limited or restricted    Time: 1417-1436 PT Time Calculation (min) (ACUTE ONLY): 19 min   Charges:   PT Evaluation $PT Eval Low Complexity: 1 Procedure     PT G Codes:   PT G-Codes **NOT FOR INPATIENT CLASS** Functional Assessment Tool Used: Clinical reasoning Functional Limitation: Mobility: Walking and moving around Mobility: Walking and Moving Around Current Status (V7846): At least 1 percent but less than 20 percent impaired, limited or restricted Mobility: Walking and Moving Around Goal Status [phone]): 0 percent impaired, limited or restricted    D. Elly Modena PT, DPT 06/17/16, 3:33 PM

## 2016-06-17 NOTE — Progress Notes (Signed)
Discharge instructions given and went over with patient and wife at bedside. Prescriptions given. All questions answered. Patient discharged home with home health via wheelchair by nursing staff. Bo McclintockBrewer,Oneita Allmon S, RN

## 2016-06-17 NOTE — Discharge Summary (Signed)
[facility] - [place] [facility]   PATIENT NAME: Mark Armstrong    MR#:  [id number]  DATE OF BIRTH:  1951-04-19  DATE OF ADMISSION:  06/16/2016 ADMITTING PHYSICIAN: Houston Siren, MD  DATE OF DISCHARGE: 06/17/16 PRIMARY CARE PHYSICIAN: Sharilyn Sites, MD    ADMISSION DIAGNOSIS:  Delirium [R41.0] Polypharmacy [Z79.899]  DISCHARGE DIAGNOSIS:  Active Problems:   Frequent falls Acute low back pain  SECONDARY DIAGNOSIS:   Past Medical History:  Diagnosis Date  . 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 CHF (congestive heart failure) (HCC)   . Diabetes mellitus without complication (HCC)   . Glaucoma   . Hypertension   . Stroke [facility])     HOSPITAL COURSE:  Mark Armstrong  is a 66 y.o. male with a known history of Charcot-Marie-Tooth disease, history of CHF, diabetes, hypertension, glaucoma, history of previous CVA, chronic pain syndrome who presents to the hospital due to a little and back pain. As per the wife patient was also somewhat confused but his mental status since then has improved. Patient's wife says that he fell today at home his head on the table at home. Patient does not recall the events completely. There has been no change in any of his medications. Patient denies any headache, nausea, vomiting, chest pain, shortness of breath. He does admit to back pain which is chronic for him. As per the wife and the daughter at bedside they do think that the patient has been more weak and has been falling frequently at home over the past year or so. Patient currently does not have any home health services. Given his ongoing back pain and frequent falls hospitalist services were contacted further treatment and evaluation. Please review history and physical for details  Hospital course  1. Frequent falls/acute on chronic low back pain-secondary to deconditioning and also ongoing chronic pain. -Supportive care with pain control with fentanyl patch, Percocet,  Lyrica, elavil. Discharge patient with 5 fentanyl patches and 30 Pills of norco to take as needed with no refills -Patient refused MRI of the lumbar spine, orthopedic consult and PT evaluation -Patient prefers getting outpatient follow-up with orthopedics Dr. Rosita Kea, he is seeing Dr. Gwynneth Macleod left-sided middle finger infection  2. DM Type II - continue metformin, Glipizide,  Lantus and SSI and follow bS.  - on Carb control diet.   3. Hyperlipidemia - cont. Cont. Atorvastatin  4. GERD - cont. Nexium.   5. Hx of Gout - cont. Colchicine.   6. Acute on Chronic back pain - cont. Fentanyl patch, Norco, Elavil.    DISCHARGE CONDITIONS:   Fair  CONSULTS OBTAINED:     PROCEDURES None  DRUG ALLERGIES:   Allergies  Allergen Reactions  . Penicillins Rash    DISCHARGE MEDICATIONS:   Current Discharge Medication List    START taking these medications   Details  acetaminophen (TYLENOL) 325 MG tablet Take 2 tablets (650 mg total) by mouth every 6 (six) hours as needed for mild pain (or Fever >/= 101).      CONTINUE these medications which have CHANGED   Details  fentaNYL (DURAGESIC - DOSED MCG/HR) 50 MCG/HR Place 1 patch (50 mcg total) onto the skin every 3 (three) days. Qty: 5 patch, Refills: 0    HYDROcodone-acetaminophen (NORCO) 5-325 MG tablet Take 1 tablet by mouth every 6 (six) hours as needed for moderate pain. Qty: 30 tablet, Refills: 0      CONTINUE these medications which have NOT CHANGED  Details  amitriptyline (ELAVIL) 150 MG tablet Take 150 mg by mouth daily.    aspirin 81 MG tablet Take 81 mg by mouth daily.    atorvastatin (LIPITOR) 80 MG tablet Take 80 mg by mouth daily.    carvedilol (COREG) 12.5 MG tablet Take 12.5 mg by mouth 2 (two) times daily.     colchicine 0.6 MG tablet Take 0.6 mg by mouth 2 (two) times daily.    insulin glargine (LANTUS) 100 UNIT/ML injection Inject 55-60 Units into the skin at bedtime.     oxyCODONE-acetaminophen  (PERCOCET) 10-325 MG tablet Take 1 tablet by mouth every 6 (six) hours as needed. for pain Refills: 0    pregabalin (LYRICA) 100 MG capsule Take 100 mg by mouth 3 (three) times daily.    !! zolpidem (AMBIEN) 10 MG tablet Take 10 mg by mouth at bedtime as needed for sleep.     !! zolpidem (AMBIEN) 5 MG tablet Take 2.5 mg by mouth at bedtime as needed.    clopidogrel (PLAVIX) 75 MG tablet Take 75 mg by mouth daily.    esomeprazole (NEXIUM) 40 MG capsule Take 40 mg by mouth daily.    furosemide (LASIX) 40 MG tablet Take 40 mg by mouth 2 (two) times daily. Refills: 1    gemfibrozil (LOPID) 600 MG tablet Take 600 mg by mouth 2 (two) times daily.    glipiZIDE (GLUCOTROL XL) 10 MG 24 hr tablet Take 20 mg by mouth daily.    metFORMIN (GLUCOPHAGE) 500 MG tablet Take 500 mg by mouth 2 (two) times daily with a meal.    potassium chloride SA (K-DUR,KLOR-CON) 20 MEQ tablet Take 20 mEq by mouth daily.     !! - Potential duplicate medications found. Please discuss with provider.       DISCHARGE INSTRUCTIONS:   Follow-up with primary care physician as scheduled on 06/19/2016 Follow-up with orthopedics Dr. Rosita Kea in 3-5 days  DIET:  Diabetic diet  DISCHARGE CONDITION:  Fair  ACTIVITY:  Activity as tolerated  OXYGEN:  Home Oxygen: No.   Oxygen Delivery: room air  DISCHARGE LOCATION:  home   If you experience worsening of your admission symptoms, develop shortness of breath, life threatening emergency, suicidal or homicidal thoughts you must seek medical attention immediately by calling 911 or calling your MD immediately  if symptoms less severe.  You Must read complete instructions/literature along with all the possible adverse reactions/side effects for all the Medicines you take and that have been prescribed to you. Take any new Medicines after you have completely understood and accpet all the possible adverse reactions/side effects.   Please note  You were cared for by a  hospitalist during your hospital stay. If you have any questions about your discharge medications or the care you received while you were in the hospital after you are discharged, you can call the unit and asked to speak with the hospitalist on call if the hospitalist that took care of you is not available. Once you are discharged, your primary care physician will handle any further medical issues. Please note that NO REFILLS for any discharge medications will be authorized once you are discharged, as it is imperative that you return to your primary care physician (or establish a relationship with a primary care physician if you do not have one) for your aftercare needs so that they can reassess your need for medications and monitor your lab values.     Today  Chief Complaint  Patient presents with  .  Chest Pain  . Fall   Patient is reporting acute on chronic lower back pain. Pain is well controlled with fentanyl patch and Norco. Refused MRI of the lumbar spine and orthopedics consult and PT evaluation and wants to go home. Patient sees orthopedics Dr. Rosita Kea as an outpatient Wife at bedside agreeable with the plan  ROS:  CONSTITUTIONAL: Denies fevers, chills. Denies any fatigue, weakness.  EYES: Denies blurry vision, double vision, eye pain. EARS, NOSE, THROAT: Denies tinnitus, ear pain, hearing loss. RESPIRATORY: Denies cough, wheeze, shortness of breath.  CARDIOVASCULAR: Denies chest pain, palpitations, edema.  GASTROINTESTINAL: Denies nausea, vomiting, diarrhea, abdominal pain. Denies bright red blood per rectum. GENITOURINARY: Denies dysuria, hematuria. ENDOCRINE: Denies nocturia or thyroid problems. HEMATOLOGIC AND LYMPHATIC: Denies easy bruising or bleeding. SKIN: Denies rash or lesion. MUSCULOSKELETAL: Chronic back pain getting worse per patient's report  Denies pain in neck,shoulder, knees, hips or arthritic symptoms.  NEUROLOGIC: Denies paralysis, paresthesias.  PSYCHIATRIC:  Denies anxiety or depressive symptoms.   VITAL SIGNS:  Blood pressure (!) 103/52, pulse (!) 43, temperature 97.3 F (36.3 C), temperature source Oral, resp. rate 17, height 5\' 11"  (1.803 m), weight 99.8 kg (220 lb 2 oz), SpO2 97 %.  I/O:    Intake/Output Summary (Last 24 hours) at 06/17/16 1504 Last data filed at 06/16/16 2200  Gross per 24 hour  Intake              120 ml  Output              300 ml  Net             -180 ml    PHYSICAL EXAMINATION:  GENERAL:  67 y.o.-year-old patient lying in the bed with no acute distress.  EYES: Pupils equal, round, reactive to light and accommodation. No scleral icterus. Extraocular muscles intact.  HEENT: Head atraumatic, normocephalic. Oropharynx and nasopharynx clear.  NECK:  Supple, no jugular venous distention. No thyroid enlargement, no tenderness.  LUNGS: Normal breath sounds bilaterally, no wheezing, rales,rhonchi or crepitation. No use of accessory muscles of respiration.  CARDIOVASCULAR: S1, S2 normal. No murmurs, rubs, or gallops.  ABDOMEN: Soft, non-tender, non-distended. Bowel sounds present. No organomegaly or mass.  EXTREMITIES: No pedal edema, cyanosis, or clubbing.  NEUROLOGIC: Cranial nerves II through XII are intact.  Sensory intact. Motor was deferred by the patient. Gait not checked  PSYCHIATRIC: The patient is alert and oriented x 3.  SKIN: No obvious rash, lesion, or ulcer.   DATA REVIEW:   CBC  Recent Labs Lab 06/16/16 1215  WBC 6.2  HGB 14.6  HCT 41.1  PLT 150    Chemistries   Recent Labs Lab 06/16/16 1215  NA 135  K 4.3  CL 102  CO2 27  GLUCOSE 282*  BUN 20  CREATININE 1.07  CALCIUM 9.1    Cardiac Enzymes  Recent Labs Lab 06/16/16 1215  TROPONINI <0.03    Microbiology Results  Results for orders placed or performed during the hospital encounter of 04/18/16  Culture, blood (routine x 2)     Status: None   Collection Time: 04/18/16 12:52 AM  Result Value Ref Range Status   Specimen  Description BLOOD  LEFT ARM  Final   Special Requests   Final    BOTTLES DRAWN AEROBIC AND ANAEROBIC  ANA AER   Culture NO GROWTH 5 DAYS  Final   Report Status 04/23/2016 FINAL  Final  Culture, blood (routine x 2)  Status: None   Collection Time: 04/18/16 12:52 AM  Result Value Ref Range Status   Specimen Description BLOOD  RIGHT FOREARM  Final   Special Requests   Final    BOTTLES DRAWN AEROBIC AND ANAEROBIC  ANA AER    Culture NO GROWTH 5 DAYS  Final   Report Status 04/23/2016 FINAL  Final  Urine culture     Status: None   Collection Time: 04/18/16  1:17 AM  Result Value Ref Range Status   Specimen Description URINE, RANDOM  Final   Special Requests Normal  Final   Culture NO GROWTH Performed at [facility]   Final   Report Status 04/19/2016 FINAL  Final    RADIOLOGY:  Dg Chest 2 View  Result Date: 06/16/2016 CLINICAL DATA:  Chest pain. EXAM: CHEST  2 VIEW COMPARISON:  04/18/2016 and prior exam FINDINGS: Cardiomegaly and CABG changes again noted. Mild peribronchial thickening is unchanged. There is no evidence of focal airspace disease, pulmonary edema, suspicious pulmonary nodule/mass, pleural effusion, or pneumothorax. No acute bony abnormalities are identified. IMPRESSION: Cardiomegaly without evidence of acute cardiopulmonary disease. Electronically Signed   By: Harmon Pier M.D.   On: 06/16/2016 13:26   Dg Lumbar Spine 2-3 Views  Result Date: 06/16/2016 CLINICAL DATA:  Low back and lumbar spine pain following fall. Initial encounter. EXAM: LUMBAR SPINE - 2-3 VIEW COMPARISON:  03/10/2015 and prior exams FINDINGS: There is no evidence of acute fracture or subluxation. Mild superior endplate compression fracture of L1 is unchanged. Mild multilevel degenerative disc disease again identified. Right renal calculi are again identified. IMPRESSION: No evidence of acute abnormality. Unchanged mild L1 superior endplate compression fracture and mild multilevel  degenerative disc disease. Electronically Signed   By: Harmon Pier M.D.   On: 06/16/2016 13:28   Ct Head Wo Contrast  Result Date: 06/16/2016 CLINICAL DATA:  Status post falling forward. EXAM: CT HEAD WITHOUT CONTRAST CT CERVICAL SPINE WITHOUT CONTRAST TECHNIQUE: Multidetector CT imaging of the head and cervical spine was performed following the standard protocol without intravenous contrast. Multiplanar CT image reconstructions of the cervical spine were also generated. COMPARISON:  04/18/2016 FINDINGS: CT HEAD FINDINGS Brain: No evidence of acute infarction, hemorrhage, hydrocephalus, extra-axial collection or mass lesion/mass effect. Mild brain parenchymal volume loss. Vascular: Calcific atherosclerotic disease at the skullbase. Skull: Choose were Sinuses/Orbits: Polypoid mucosal thickening of the right maxillary sinus. Sinuses and mastoid air cells otherwise well aerated. Other: Left orbit postsurgical changes. CT CERVICAL SPINE FINDINGS Alignment: Normal. Skull base and vertebrae: No acute fracture. No primary bone lesion or focal pathologic process. Soft tissues and spinal canal: No prevertebral fluid or swelling. No visible canal hematoma. Disc levels: Mild multilevel osteoarthritic changes of the cervical spine. Upper chest: 5 mm soft tissue pulmonary nodule in the right apex. Other: None. IMPRESSION: No evidence of acute intracranial abnormality. Mild brain parenchymal volume loss. No evidence of acute traumatic injury to cervical spine. Right maxillary chronic sinusitis. 5 mm soft tissue pulmonary nodules in the right apex. No follow-up needed if patient is low-risk. Non-contrast chest CT can be considered in 12 months if patient is high-risk. This recommendation follows the consensus statement: Guidelines for Management of Incidental Pulmonary Nodules Detected on CT Images: From the Fleischner Society 2017; Radiology 2017; 284:228-243. Electronically Signed   By: Ted Mcalpine M.D.   On:  06/16/2016 13:26   Ct Cervical Spine Wo Contrast  Result Date: 06/16/2016 CLINICAL DATA:  Status post falling forward. EXAM: CT HEAD  WITHOUT CONTRAST CT CERVICAL SPINE WITHOUT CONTRAST TECHNIQUE: Multidetector CT imaging of the head and cervical spine was performed following the standard protocol without intravenous contrast. Multiplanar CT image reconstructions of the cervical spine were also generated. COMPARISON:  04/18/2016 FINDINGS: CT HEAD FINDINGS Brain: No evidence of acute infarction, hemorrhage, hydrocephalus, extra-axial collection or mass lesion/mass effect. Mild brain parenchymal volume loss. Vascular: Calcific atherosclerotic disease at the skullbase. Skull: Choose were Sinuses/Orbits: Polypoid mucosal thickening of the right maxillary sinus. Sinuses and mastoid air cells otherwise well aerated. Other: Left orbit postsurgical changes. CT CERVICAL SPINE FINDINGS Alignment: Normal. Skull base and vertebrae: No acute fracture. No primary bone lesion or focal pathologic process. Soft tissues and spinal canal: No prevertebral fluid or swelling. No visible canal hematoma. Disc levels: Mild multilevel osteoarthritic changes of the cervical spine. Upper chest: 5 mm soft tissue pulmonary nodule in the right apex. Other: None. IMPRESSION: No evidence of acute intracranial abnormality. Mild brain parenchymal volume loss. No evidence of acute traumatic injury to cervical spine. Right maxillary chronic sinusitis. 5 mm soft tissue pulmonary nodules in the right apex. No follow-up needed if patient is low-risk. Non-contrast chest CT can be considered in 12 months if patient is high-risk. This recommendation follows the consensus statement: Guidelines for Management of Incidental Pulmonary Nodules Detected on CT Images: From the Fleischner Society 2017; Radiology 2017; 284:228-243. Electronically Signed   By: Ted Mcalpineobrinka  Dimitrova M.D.   On: 06/16/2016 13:26    EKG:   Orders placed or performed during the  hospital encounter of 06/16/16  . ED EKG within 10 minutes  . ED EKG within 10 minutes  . EKG 12-Lead  . EKG 12-Lead  . EKG 12-Lead  . EKG 12-Lead      Management plans discussed with the patient, family and they are in agreement.  CODE STATUS:     Code Status Orders        Start     Ordered   06/16/16 1849  Full code  Continuous     06/16/16 1848    Code Status History    Date Active Date Inactive Code Status Order ID Comments User Context   04/18/2016  7:57 AM 04/19/2016  5:13 PM Full Code [id number]  Arnaldo NatalMichael S Diamond, MD Inpatient   12/27/2015  5:53 PM 12/27/2015  9:55 PM Full Code [id number]  Kennedy BuckerMichael Menz, MD Inpatient   12/20/2015  8:22 PM 12/22/2015  4:40 PM Full Code [id number]  Alford Highlandichard Wieting, MD ED      TOTAL TIME TAKING CARE OF THIS PATIENT: 43  minutes.   Note: This dictation was prepared with Dragon dictation along with smaller phrase technology. Any transcriptional errors that result from this process are unintentional.   @[facility] @  on 06/17/2016 at 3:04 PM  Between 7am to 6pm - Pager - [phone]  After 6pm go to [email or web address] - password [email or web address] [facility]  [facility]  Office  [phone]  CC: Primary care physician; Sharilyn SitesMark Heffington, MD

## 2016-06-17 NOTE — Progress Notes (Signed)
PT Cancellation Note  Patient Details Name: Mark LowensteinBoyd D Warne MRN: [id number] DOB: [birth year]   Cancelled Treatment:    Reason Eval/Treat Not Completed: Patient declined, no reason specified;  Pt refused PT eval secondary to not willing to attempt transfers/gait without shoes from home.  Pt stated would call wife and attempt to have shoes brought to hospital.  Will attempt PT eval at a future time as appropriate.     Thomes Dinningavid Scott Eyvonne Burchfield 06/17/2016, 12:35 PM

## 2016-06-17 NOTE — Discharge Instructions (Signed)
Follow-up with primary care physician as scheduled on 06/19/2016 Follow-up with orthopedics Dr. Rosita KeaMENZ in 3-5 days

## 2016-06-17 NOTE — NC FL2 (Signed)
[place] MEDICAID FL2 LEVEL OF CARE SCREENING TOOL     IDENTIFICATION  Patient Name: Mark Armstrong Birthdate: [birth year] Sex: male Admission Date (Current Location): 06/16/2016  [place] and [place] Number:  Chiropodist and Address:  [facility], [address] [place], [place] [address]      Provider Number: [id number]  Attending Physician Name and Address:  Ramonita Lab, MD  Relative Name and Phone Number:       Current Level of Care: Hospital Recommended Level of Care: Skilled Nursing Facility Prior Approval Number:    Date Approved/Denied:   PASRR Number:  [id number])  Discharge Plan: SNF    Current Diagnoses: Patient Active Problem List   Diagnosis Date Noted  . Frequent falls 06/16/2016  . Acute delirium 04/18/2016  . Chronic pain syndrome 04/18/2016  . Cellulitis 12/20/2015    Orientation RESPIRATION BLADDER Height & Weight     Self, Situation, Place  Normal Continent Weight: 220 lb 2 oz (99.8 kg) Height:  5\' 11"  (180.3 cm)  BEHAVIORAL SYMPTOMS/MOOD NEUROLOGICAL BOWEL NUTRITION STATUS   (none)  (none) Continent Diet (Diet: Heart Healthy )  AMBULATORY STATUS COMMUNICATION OF NEEDS Skin   Extensive Assist Verbally Other (Comment) (Burn on right hand on finger)                       Personal Care Assistance Level of Assistance  Bathing, Feeding, Dressing Bathing Assistance: Limited assistance Feeding assistance: Independent Dressing Assistance: Limited assistance     Functional Limitations Info  Sight, Hearing, Speech Sight Info: Adequate Hearing Info: Adequate Speech Info: Adequate    SPECIAL CARE FACTORS FREQUENCY  PT (By licensed PT), OT (By licensed OT)     PT Frequency:  (5) OT Frequency:  (5)            Contractures      Additional Factors Info  Code Status, Allergies, Insulin Sliding Scale Code Status Info:  (Full Code. ) Allergies Info:  (Penicillins )   Insulin Sliding Scale Info:   (NovoLog Insulin Injections )       Current Medications (06/17/2016):  This is the current hospital active medication list Current Facility-Administered Medications  Medication Dose Route Frequency Provider Last Rate Last Dose  . acetaminophen (TYLENOL) tablet 650 mg  650 mg Oral Q6H PRN Houston Siren, MD       Or  . acetaminophen (TYLENOL) suppository 650 mg  650 mg Rectal Q6H PRN Houston Siren, MD      . amitriptyline (ELAVIL) tablet 150 mg  150 mg Oral Daily Houston Siren, MD      . aspirin EC tablet 81 mg  81 mg Oral Daily Houston Siren, MD      . atorvastatin (LIPITOR) tablet 80 mg  80 mg Oral Daily Houston Siren, MD      . carvedilol (COREG) tablet 12.5 mg  12.5 mg Oral BID Houston Siren, MD   12.5 mg at 06/16/16 2054  . clopidogrel (PLAVIX) tablet 75 mg  75 mg Oral Daily Houston Siren, MD      . colchicine tablet 0.6 mg  0.6 mg Oral BID Houston Siren, MD      . enoxaparin (LOVENOX) injection 40 mg  40 mg Subcutaneous Q24H Houston Siren, MD   40 mg at 06/16/16 2053  . fentaNYL (DURAGESIC - dosed mcg/hr) 50 mcg  50 mcg Transdermal Q72H Houston Siren, MD  50 mcg at 06/16/16 2054  . furosemide (LASIX) tablet 40 mg  40 mg Oral BID Houston SirenVivek J Sainani, MD   40 mg at 06/17/16 0740  . insulin aspart (novoLOG) injection 0-9 Units  0-9 Units Subcutaneous TID WC Houston SirenVivek J Sainani, MD   3 Units at 06/17/16 0741  . insulin glargine (LANTUS) injection 50 Units  50 Units Subcutaneous QHS Houston SirenVivek J Sainani, MD   50 Units at 06/16/16 2102  . metFORMIN (GLUCOPHAGE) tablet 500 mg  500 mg Oral BID WC Houston SirenVivek J Sainani, MD   500 mg at 06/17/16 0740  . ondansetron (ZOFRAN) tablet 4 mg  4 mg Oral Q6H PRN Houston SirenVivek J Sainani, MD       Or  . ondansetron (ZOFRAN) injection 4 mg  4 mg Intravenous Q6H PRN Houston SirenVivek J Sainani, MD      . oxyCODONE-acetaminophen (PERCOCET/ROXICET) 5-325 MG per tablet 1 tablet  1 tablet Oral Q6H PRN Houston SirenVivek J Sainani, MD   1 tablet at 06/16/16 2054   And  . oxyCODONE (Oxy  IR/ROXICODONE) immediate release tablet 5 mg  5 mg Oral Q6H PRN Houston SirenVivek J Sainani, MD   5 mg at 06/17/16 0741  . pantoprazole (PROTONIX) EC tablet 40 mg  40 mg Oral Daily Houston SirenVivek J Sainani, MD      . potassium chloride SA (K-DUR,KLOR-CON) CR tablet 20 mEq  20 mEq Oral Daily Houston SirenVivek J Sainani, MD      . pregabalin (LYRICA) capsule 100 mg  100 mg Oral TID Houston SirenVivek J Sainani, MD   100 mg at 06/16/16 2054  . zolpidem (AMBIEN) tablet 10 mg  10 mg Oral QHS PRN Houston SirenVivek J Sainani, MD   10 mg at 06/16/16 2056     Discharge Medications: Please see discharge summary for a list of discharge medications.  Relevant Imaging Results:  Relevant Lab Results:   Additional Information  (SSN: [id number])  Joas Motton, Darleen CrockerBailey M, LCSW

## 2016-09-19 ENCOUNTER — Emergency Department: Payer: Medicare Other

## 2016-09-19 ENCOUNTER — Emergency Department
Admission: EM | Admit: 2016-09-19 | Discharge: 2016-09-19 | Payer: Medicare Other | Attending: Emergency Medicine | Admitting: Emergency Medicine

## 2016-09-19 DIAGNOSIS — Y999 Unspecified external cause status: Secondary | ICD-10-CM | POA: Insufficient documentation

## 2016-09-19 DIAGNOSIS — S065X0A Traumatic subdural hemorrhage without loss of consciousness, initial encounter: Secondary | ICD-10-CM | POA: Insufficient documentation

## 2016-09-19 DIAGNOSIS — G935 Compression of brain: Secondary | ICD-10-CM | POA: Diagnosis not present

## 2016-09-19 DIAGNOSIS — W1809XA Striking against other object with subsequent fall, initial encounter: Secondary | ICD-10-CM | POA: Insufficient documentation

## 2016-09-19 DIAGNOSIS — E119 Type 2 diabetes mellitus without complications: Secondary | ICD-10-CM | POA: Diagnosis not present

## 2016-09-19 DIAGNOSIS — Z87891 Personal history of nicotine dependence: Secondary | ICD-10-CM | POA: Insufficient documentation

## 2016-09-19 DIAGNOSIS — Z794 Long term (current) use of insulin: Secondary | ICD-10-CM | POA: Insufficient documentation

## 2016-09-19 DIAGNOSIS — S0990XA Unspecified injury of head, initial encounter: Secondary | ICD-10-CM | POA: Diagnosis present

## 2016-09-19 DIAGNOSIS — Y9389 Activity, other specified: Secondary | ICD-10-CM | POA: Diagnosis not present

## 2016-09-19 DIAGNOSIS — I509 Heart failure, unspecified: Secondary | ICD-10-CM | POA: Diagnosis not present

## 2016-09-19 DIAGNOSIS — Y92009 Unspecified place in unspecified non-institutional (private) residence as the place of occurrence of the external cause: Secondary | ICD-10-CM | POA: Diagnosis not present

## 2016-09-19 DIAGNOSIS — I110 Hypertensive heart disease with heart failure: Secondary | ICD-10-CM | POA: Diagnosis not present

## 2016-09-19 DIAGNOSIS — Z7982 Long term (current) use of aspirin: Secondary | ICD-10-CM | POA: Insufficient documentation

## 2016-09-19 DIAGNOSIS — S065X9A Traumatic subdural hemorrhage with loss of consciousness of unspecified duration, initial encounter: Secondary | ICD-10-CM

## 2016-09-19 DIAGNOSIS — S065XAA Traumatic subdural hemorrhage with loss of consciousness status unknown, initial encounter: Secondary | ICD-10-CM

## 2016-09-19 LAB — URINALYSIS, COMPLETE (UACMP) WITH MICROSCOPIC
BACTERIA UA: NONE SEEN
BILIRUBIN URINE: NEGATIVE
Glucose, UA: >=500 mg/dL — AB
Ketones, ur: 5 mg/dL — AB
LEUKOCYTES UA: NEGATIVE
Nitrite: NEGATIVE
PH: 5 (ref 5.0–8.0)
Protein, ur: 100 mg/dL — AB
SPECIFIC GRAVITY, URINE: 1.013 (ref 1.005–1.030)
SQUAMOUS EPITHELIAL / LPF: NONE SEEN

## 2016-09-19 LAB — CBC
HEMATOCRIT: 46.4 % (ref 40.0–52.0)
Hemoglobin: 15.8 g/dL (ref 13.0–18.0)
MCH: 29.9 pg (ref 26.0–34.0)
MCHC: 34 g/dL (ref 32.0–36.0)
MCV: 87.9 fL (ref 80.0–100.0)
PLATELETS: 218 10*3/uL (ref 150–440)
RBC: 5.28 MIL/uL (ref 4.40–5.90)
RDW: 15.3 % — AB (ref 11.5–14.5)
WBC: 8.3 10*3/uL (ref 3.8–10.6)

## 2016-09-19 LAB — BASIC METABOLIC PANEL
ANION GAP: 11 (ref 5–15)
BUN: 19 mg/dL (ref 6–20)
CALCIUM: 9.1 mg/dL (ref 8.9–10.3)
CO2: 27 mmol/L (ref 22–32)
CREATININE: 0.8 mg/dL (ref 0.61–1.24)
Chloride: 95 mmol/L — ABNORMAL LOW (ref 101–111)
Glucose, Bld: 221 mg/dL — ABNORMAL HIGH (ref 65–99)
Potassium: 3.5 mmol/L (ref 3.5–5.1)
SODIUM: 133 mmol/L — AB (ref 135–145)

## 2016-09-19 LAB — PROTIME-INR
INR: 1.08
PROTHROMBIN TIME: 14 s (ref 11.4–15.2)

## 2016-09-19 LAB — APTT: APTT: 27 s (ref 24–36)

## 2016-09-19 MED ORDER — NICARDIPINE HCL IN NACL 20-0.86 MG/200ML-% IV SOLN
3.0000 mg/h | Freq: Once | INTRAVENOUS | Status: DC
  Filled 2016-09-19: qty 200

## 2016-09-19 MED ORDER — PROPOFOL 1000 MG/100ML IV EMUL
5.0000 ug/kg/min | Freq: Once | INTRAVENOUS | Status: AC
  Administered 2016-09-19: 35 ug/kg/min via INTRAVENOUS

## 2016-09-19 MED ORDER — SODIUM CHLORIDE 3 % IV SOLN
INTRAVENOUS | Status: DC
  Administered 2016-09-19: 50 mL/h via INTRAVENOUS
  Filled 2016-09-19 (×3): qty 500

## 2016-09-19 MED ORDER — PROPOFOL 1000 MG/100ML IV EMUL
INTRAVENOUS | Status: AC
  Filled 2016-09-19: qty 100

## 2016-09-19 MED ORDER — ETOMIDATE 2 MG/ML IV SOLN
INTRAVENOUS | Status: DC | PRN
  Administered 2016-09-19: 20 mg via INTRAVENOUS

## 2016-09-19 MED ORDER — PROPOFOL 1000 MG/100ML IV EMUL
INTRAVENOUS | Status: AC
  Administered 2016-09-19: 35 ug/kg/min via INTRAVENOUS
  Filled 2016-09-19: qty 100

## 2016-09-19 MED ORDER — LABETALOL HCL 5 MG/ML IV SOLN
10.0000 mg | Freq: Once | INTRAVENOUS | Status: AC
  Administered 2016-09-19: 10 mg via INTRAVENOUS

## 2016-09-19 MED ORDER — SUCCINYLCHOLINE CHLORIDE 20 MG/ML IJ SOLN
INTRAMUSCULAR | Status: DC | PRN
  Administered 2016-09-19: 100 mg via INTRAVENOUS

## 2016-09-19 MED ORDER — LABETALOL HCL 5 MG/ML IV SOLN
INTRAVENOUS | Status: AC
Start: 2016-09-19 — End: 2016-09-19
  Administered 2016-09-19: 10 mg via INTRAVENOUS
  Filled 2016-09-19: qty 4

## 2016-09-19 MED ORDER — PROPOFOL 1000 MG/100ML IV EMUL
INTRAVENOUS | Status: DC | PRN
  Administered 2016-09-19: 20:00:00 via INTRAVENOUS
  Administered 2016-09-19: 66.8 ug/kg/min via INTRAVENOUS
  Administered 2016-09-19: 19:00:00 via INTRAVENOUS

## 2016-09-19 NOTE — ED Notes (Signed)
[facility]. ETA 30 minutes

## 2016-09-19 NOTE — ED Notes (Signed)
[facility] at bedside 

## 2016-09-19 NOTE — ED Notes (Signed)
Patient nonverbal to commands at this time.  Increased lethargy per family.  MD applied sternal rub with minimal response by patient.  This RN notified to make a priority for CT scan.  Radiology called and informed and patient taken to CT at this time.

## 2016-09-19 NOTE — ED Triage Notes (Signed)
Pt brought by EMS from home for complaint of fall. Per EMS no LOC. Family stated he fell coming out of bathroom and hit pretty hard. Pt's family also stated strong smell when changing pt. Pt not responding to questions at this time. EMS reports vitals stable. [id number]. Pt resting and in no apparent distress at this time. Family at bedside

## 2016-09-19 NOTE — ED Notes (Signed)
Per family, patient has had increasing weakness for 4 days with minimal PO intake.  Explained that the patient fell around 15:00 today and they finally called EMS due to the increasing lethargy as well as a malodorous urine smell coming from the patient.

## 2016-09-19 NOTE — ED Notes (Signed)
Pt back from CT

## 2016-09-19 NOTE — ED Notes (Signed)
Pads placed on rails to protect pt from skin tears.

## 2016-09-19 NOTE — ED Provider Notes (Addendum)
[facility] Emergency Department Provider Note  ____________________________________________  Time seen: Approximately 5:56 PM  I have reviewed the triage vital signs and the nursing notes.   HISTORY  Chief Complaint Fall  Level 5 caveat:  Portions of the history and physical were unable to be obtained due to ams    HPI Mark Armstrong is a 66 y.o. male with a history of stroke on Plavix (not taking it for months), hypertension, diabetes, CHF, Charcot-Marie-Tooth and muscular atrophy, multiple falls, chronic pain syndrome who presents for evaluation of altered mental status. Patient is nonverbal at this time and unable to answer any questions which per his daughter who is at the bedside is not patient's baseline.  According to her patient had a fall earlier today while coming out of the bathroom. According to the wife patient was walking from his bedroom into the bathroom when he lost balance and fell to the ground. The wife said that he hit his head on the floor very hard. No LOC. he started complaining of pain and moaning. They called the rescue who helped patient get up and sit on the stretcher. By the time EMS arrived patient was no longer answering questions but was opening his eyes and shaking his head yes or no. On arrival to the emergency room patient is no longer opening his eyes, he is nonverbal, not following any commands. Daughter tells me that she spoke with him at [facility] this afternoon and he was in his usual state of health.Vitals and BG normal per EMS.   Past Medical History:  Diagnosis Date  . 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 CHF (congestive heart failure) (HCC)   . Diabetes mellitus without complication (HCC)   . Glaucoma   . Hypertension   . Stroke [facility])     Patient Active Problem List   Diagnosis Date Noted  . Frequent falls 06/16/2016  . Acute delirium 04/18/2016  . Chronic pain syndrome 04/18/2016  . Cellulitis 12/20/2015     Past Surgical History:  Procedure Laterality Date  . CARDIAC SURGERY    . CORONARY ARTERY BYPASS GRAFT    . EYE SURGERY    . INCISION AND DRAINAGE WOUND WITH TENDON REPAIR Left 12/27/2015   Procedure: INCISION AND DRAINAGE WOUND WITH TENDON REPAIR;  Surgeon: Kennedy Bucker, MD;  Location: [facility] ORS;  Service: Orthopedics;  Laterality: Left;  . Right foot surgery    . Right knee arthroscopy    . TOE AMPUTATION      Prior to Admission medications   Medication Sig Start Date End Date Taking? Authorizing Provider  acetaminophen (TYLENOL) 325 MG tablet Take 2 tablets (650 mg total) by mouth every 6 (six) hours as needed for mild pain (or Fever >/= 101). 06/17/16   Ramonita Lab, MD  amitriptyline (ELAVIL) 150 MG tablet Take 150 mg by mouth daily.    Historical Provider, MD  aspirin 81 MG tablet Take 81 mg by mouth daily. 05/03/04   Historical Provider, MD  atorvastatin (LIPITOR) 80 MG tablet Take 80 mg by mouth daily. 12/11/15   Historical Provider, MD  carvedilol (COREG) 12.5 MG tablet Take 12.5 mg by mouth 2 (two) times daily.  12/11/15   Historical Provider, MD  colchicine 0.6 MG tablet Take 0.6 mg by mouth 2 (two) times daily. 11/08/15   Historical Provider, MD  esomeprazole (NEXIUM) 40 MG capsule Take 40 mg by mouth daily. 11/08/13   Historical Provider, MD  fentaNYL (DURAGESIC - DOSED  MCG/HR) 50 MCG/HR Place 1 patch (50 mcg total) onto the skin every 3 (three) days. 06/17/16   Ramonita Lab, MD  furosemide (LASIX) 40 MG tablet Take 40 mg by mouth 2 (two) times daily. 09/23/15   Historical Provider, MD  gemfibrozil (LOPID) 600 MG tablet Take 600 mg by mouth 2 (two) times daily. 11/03/13   Historical Provider, MD  glipiZIDE (GLUCOTROL XL) 10 MG 24 hr tablet Take 20 mg by mouth daily. 11/08/13   Historical Provider, MD  HYDROcodone-acetaminophen (NORCO) 5-325 MG tablet Take 1 tablet by mouth every 6 (six) hours as needed for moderate pain. 06/17/16   Ramonita Lab, MD  insulin glargine (LANTUS) 100 UNIT/ML  injection Inject 55-60 Units into the skin at bedtime.  01/12/14   Historical Provider, MD  metFORMIN (GLUCOPHAGE) 500 MG tablet Take 500 mg by mouth 2 (two) times daily with a meal.    Historical Provider, MD  oxyCODONE-acetaminophen (PERCOCET) 10-325 MG tablet Take 1 tablet by mouth every 6 (six) hours as needed. for pain 05/26/16   Historical Provider, MD  potassium chloride SA (K-DUR,KLOR-CON) 20 MEQ tablet Take 20 mEq by mouth daily.    Historical Provider, MD  pregabalin (LYRICA) 100 MG capsule Take 100 mg by mouth 3 (three) times daily. 05/02/13   Historical Provider, MD  zolpidem (AMBIEN) 10 MG tablet Take 10 mg by mouth at bedtime as needed for sleep.     Historical Provider, MD  zolpidem (AMBIEN) 5 MG tablet Take 2.5 mg by mouth at bedtime as needed. 06/06/16   Historical Provider, MD    Allergies Penicillins  Family History  Problem Relation Age of Onset  . CAD Mother   . Diabetes Mother   . CAD Father   . Diabetes Father     Social History Social History  Substance Use Topics  . Smoking status: Former Smoker    Packs/day: 0.75    Years: 2.00    Types: Cigarettes  . Smokeless tobacco: Never Used  . Alcohol use No    Review of Systems  Constitutional: Negative for fever. + AMS and fall and head trauma Eyes: Negative for visual changes. ENT: Negative for sore throat. Neck: No neck pain  Cardiovascular: Negative for chest pain. Respiratory: Negative for shortness of breath. Gastrointestinal: Negative for abdominal pain, vomiting or diarrhea. Genitourinary: Negative for dysuria. Musculoskeletal: Negative for back pain. Skin: Negative for rash. Neurological: Negative for headaches, weakness or numbness. Psych: No SI or HI  ____________________________________________   PHYSICAL EXAM:  VITAL SIGNS: ED Triage Vitals [09/19/16 1739]  Enc Vitals Group     BP (!) 161/106     Pulse Rate (!) 109     Resp 20     Temp 98.3 F (36.8 C)     Temp Source Axillary      SpO2 98 %     Weight 220 lb (99.8 kg)     Height 5\' 10"  (1.778 m)     Head Circumference      Peak Flow      Pain Score      Pain Loc      Pain Edu?      Excl. in GC?     Full spinal precautions maintained throughout the trauma exam. Constitutional: GCS 7 HEENT Head: Normocephalic and atraumatic. Face: No facial bony tenderness. Stable midface Ears: No hemotympanum bilaterally. No Battle sign Eyes: No eye injury. Left pupil is tear shape and reactive, R pupil is normal. No raccoon eyes Nose: Nontender.  No epistaxis. No rhinorrhea Mouth/Throat: Mucous membranes are moist. No oropharyngeal blood. No dental injury. Airway patent without stridor.  Neck: no C-collar in place. No midline c-spine tenderness.  Cardiovascular: Tachycardic with normal rate. Normal and symmetric distal pulses are present in all extremities. Pulmonary/Chest: Chest wall is stable and nontender to palpation/compression. Normal respiratory effort. Breath sounds are normal. No crepitus.  Abdominal: Soft, non distended. Musculoskeletal: Nontender with normal full range of motion in all extremities. No deformities. No thoracic or lumbar midline spinal tenderness. Pelvis is stable. Skin: Skin is warm, dry and intact. Multiple abrasions on all extremities Neurological: GCS 7.  Glascow Coma Score: 1 - Does not open eyes 5 - Pushes away noxious stimulus 1 - Makes no noise GCS: 7   ____________________________________________   LABS (all labs ordered are listed, but only abnormal results are displayed)  Labs Reviewed  CBC - Abnormal; Notable for the following:       Result Value   RDW 15.3 (*)    All other components within normal limits  BASIC METABOLIC PANEL - Abnormal; Notable for the following:    Sodium 133 (*)    Chloride 95 (*)    Glucose, Bld 221 (*)    All other components within normal limits  URINALYSIS, COMPLETE (UACMP) WITH MICROSCOPIC - Abnormal; Notable for the following:    Color, Urine  YELLOW (*)    APPearance CLEAR (*)    Glucose, UA >=500 (*)    Hgb urine dipstick SMALL (*)    Ketones, ur 5 (*)    Protein, ur 100 (*)    All other components within normal limits  PROTIME-INR  APTT  SODIUM  SODIUM  SODIUM   ____________________________________________  EKG  none ____________________________________________  RADIOLOGY  Ct head and cspine: . Large mixed density left holo hemispheric subdural hematoma measuring at least 2 cm in thickness on axial images. Significant midline shift to the right measuring at least 2 cm with subfalcine herniation. There is protrusion of the left uncus with effacement of the left suprasellar cistern concerning for impending or early uncal herniation. Mass effect on the lateral and third ventricles. Fourth ventricle is patent. Additional subdural blood suspected along the tentorium. 2. Diffuse sulcal effacement consistent with generalized brain swelling. 3. No acute fracture or malalignment of the cervical spine allowing for patient motion ____________________________________________   PROCEDURES  Procedure(s) performed:yes Procedure Name: Intubation Date/Time: 09/19/2016 7:51 PM Performed by: Nita SickleVERONESE, [place] Oxygen Delivery Method: Nasal cannula Preoxygenation: Pre-oxygenation with 100% oxygen Intubation Type: Rapid sequence Laryngoscope Size: Glidescope and 3 Grade View: Grade I Tube size: 8.0 mm Number of attempts: 1 Airway Equipment and Method: Video-laryngoscopy Placement Confirmation: ETT inserted through vocal cords under direct vision,  Positive ETCO2,  CO2 detector and Breath sounds checked- equal and bilateral Secured at: 25 cm Tube secured with: ETT holder       Critical Care performed: yes  CRITICAL CARE Performed by: Nita Sicklearolina Isaiyah Feldhaus  ?  Total critical care time: 60 min  Critical care time was exclusive of separately billable procedures and treating other patients.  Critical care was  necessary to treat or prevent imminent or life-threatening deterioration.  Critical care was time spent personally by me on the following activities: development of treatment plan with patient and/or surrogate as well as nursing, discussions with consultants, evaluation of patient's response to treatment, examination of patient, obtaining history from patient or surrogate, ordering and performing treatments and interventions, ordering and review of laboratory studies, ordering and review  of radiographic studies, pulse oximetry and re-evaluation of patient's condition.  ____________________________________________   INITIAL IMPRESSION / ASSESSMENT AND PLAN / ED COURSE  66 y.o. male with a history of stroke on Plavix, hypertension, diabetes, CHF, Charcot-Marie-Tooth and muscular atrophy, multiple falls, chronic pain syndrome who presents for evaluation of altered mental status, after mechanical fall. Patient arrives a GCS of 7. NO obvious signs of injury to the face or head. Multiple abrasions. Patient taken STAT to CT which showed large SDH with 2 cm midline shift and subfalcine and uncus herniation. Patient intubated for airway protection with etomidate and succinylcholine. I discussed findings with Dr. Lamar Blinks, NSG who mobilized [facility] for transfer. Recommended starting patient on 3% at 46ml/hr. HOB elevated at 45 degrees, patient sedated with propofol with good BP control. Wife and daughter at the bedside were updated on patient's critical status. Both wish for full code at this time.     Pertinent labs & imaging results that were available during my care of the patient were reviewed by me and considered in my medical decision making (see chart for details).    ____________________________________________   FINAL CLINICAL IMPRESSION(S) / ED DIAGNOSES  Final diagnoses:  Brain herniation (HCC)  Subdural hematoma, acute (HCC)      NEW MEDICATIONS STARTED DURING THIS VISIT:  New  Prescriptions   No medications on file     Note:  This document was prepared using Dragon voice recognition software and may include unintentional dictation errors.    Nita Sickle, MD 09/19/16 2026    Nita Sickle, MD 09/19/16 2027

## 2016-09-19 NOTE — ED Notes (Signed)
Pt transported to CT with RN Penni BombardKendall

## 2016-10-18 DEATH — deceased

## 2017-08-03 IMAGING — DX DG CHEST 1V PORT
1 series · 1 of 1 positions shown · non-contrast
Comparison: 01/07/2012

CLINICAL DATA: Confusion

EXAM:
PORTABLE CHEST 1 VIEW

[chest ap]
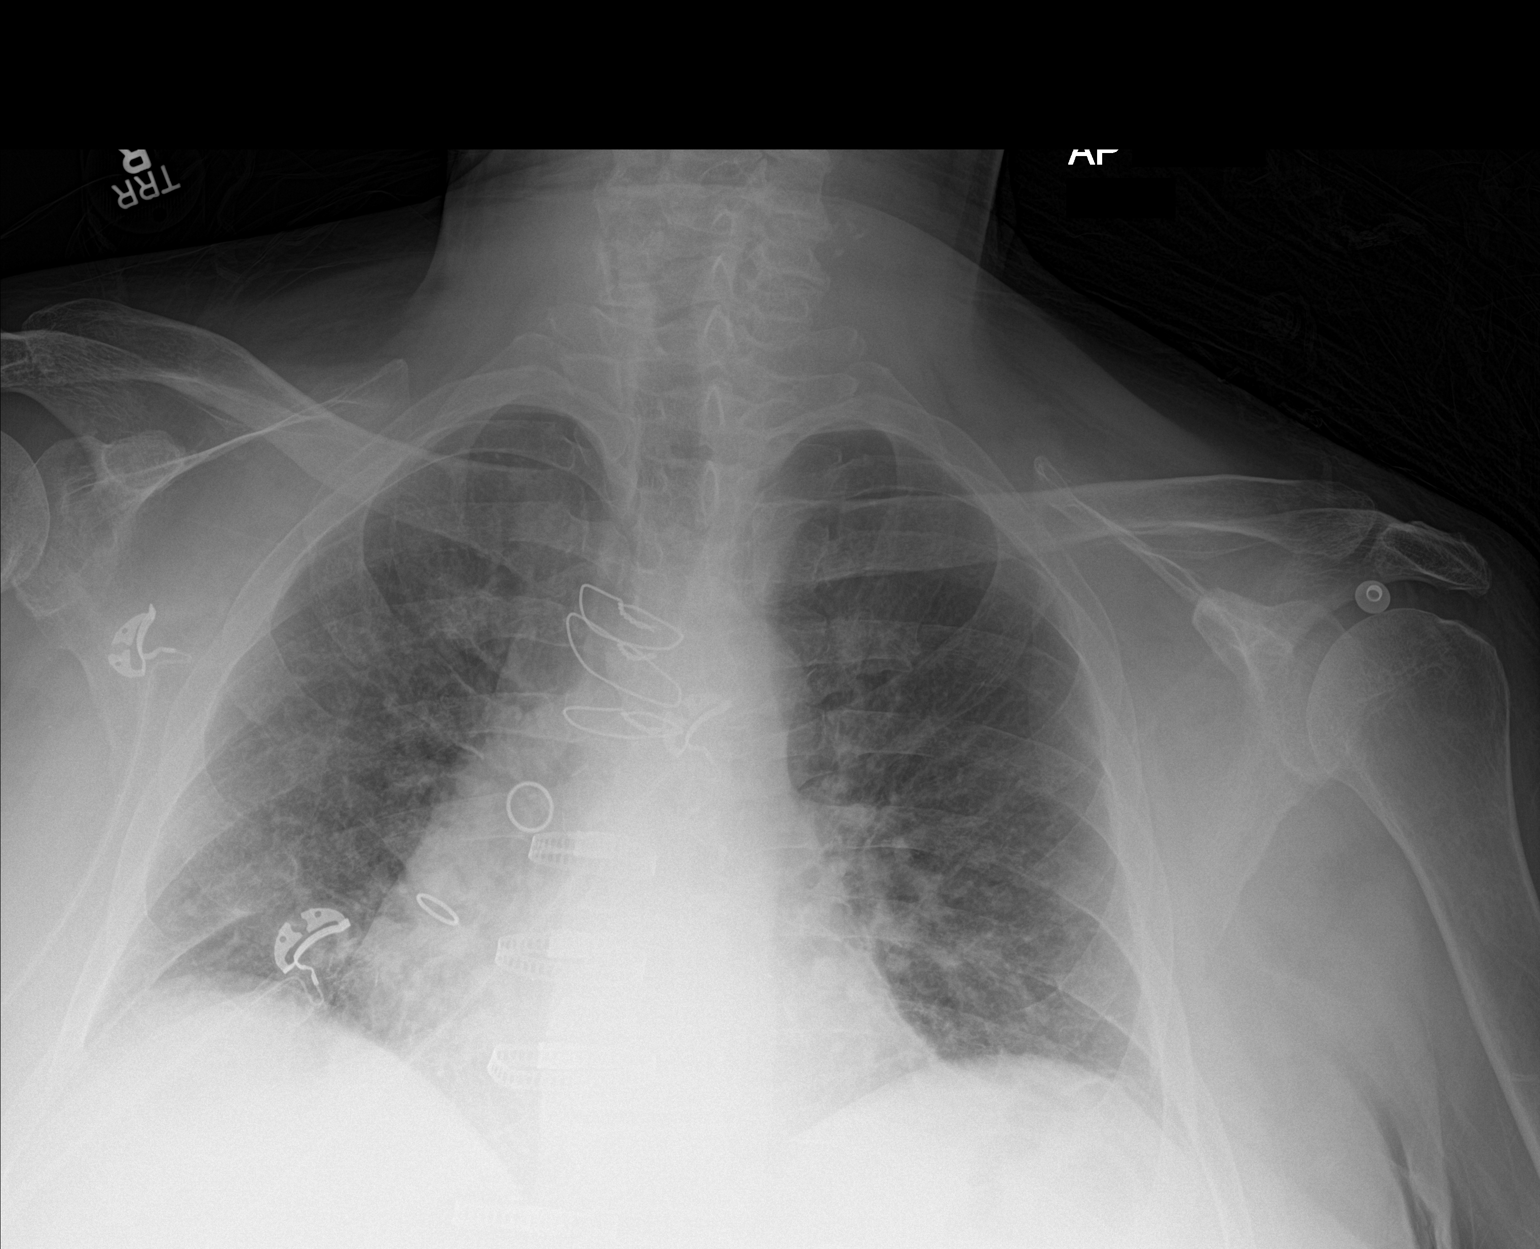

[1 of 1 positions shown; findings below may reference images not displayed]

FINDINGS: Single-view chest demonstrates median sternotomy changes. There are
low lung volumes. Linear atelectasis or scar at the left base. No
acute consolidation or effusion. Stable cardiomediastinal
silhouette. No pneumothorax. Left carotid artery calcification.
IMPRESSION: Low lung volumes with linear atelectasis or scar at the left base.

## 2017-10-01 IMAGING — CT CT CERVICAL SPINE W/O CM
3 of 10 series · 10 of 33 positions shown, 11 images · non-contrast
Comparison: 04/18/2016

CLINICAL DATA: Status post falling forward.

EXAM:
CT HEAD WITHOUT CONTRAST
CT CERVICAL SPINE WITHOUT CONTRAST
TECHNIQUE: Multidetector CT imaging of the head and cervical spine was
performed following the standard protocol without intravenous
contrast. Multiplanar CT image reconstructions of the cervical spine
were also generated.

[Series 12: sagittal bone · sagittal · 0.30mm/px · 5 of 71 slices shown]
[im 12/71  bone]
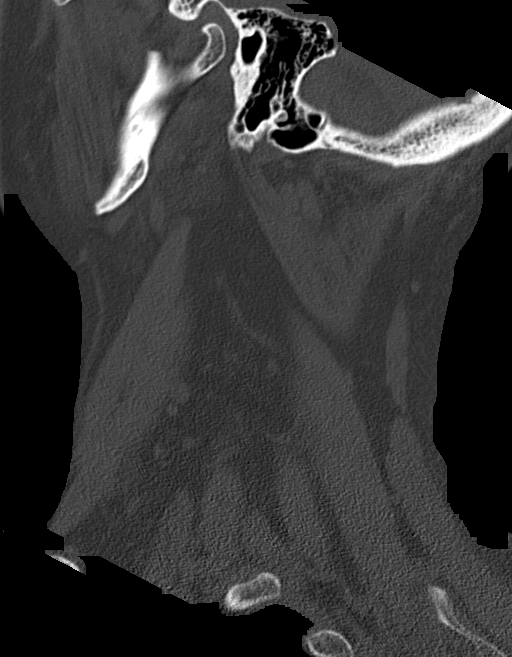
[im 24/71  bone]
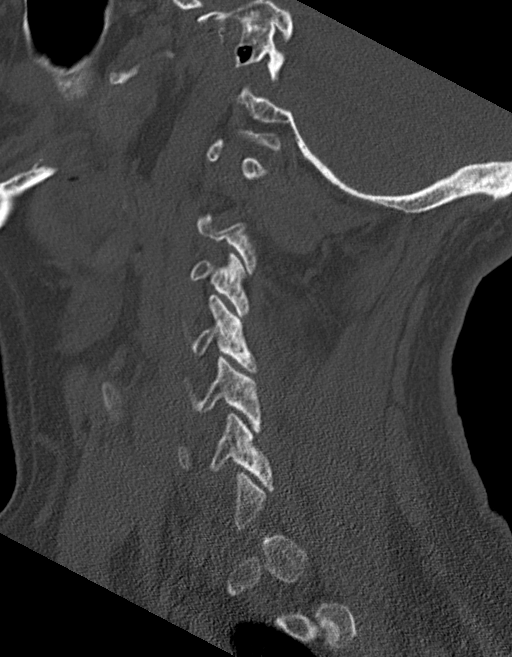
[im 36/71  bone]
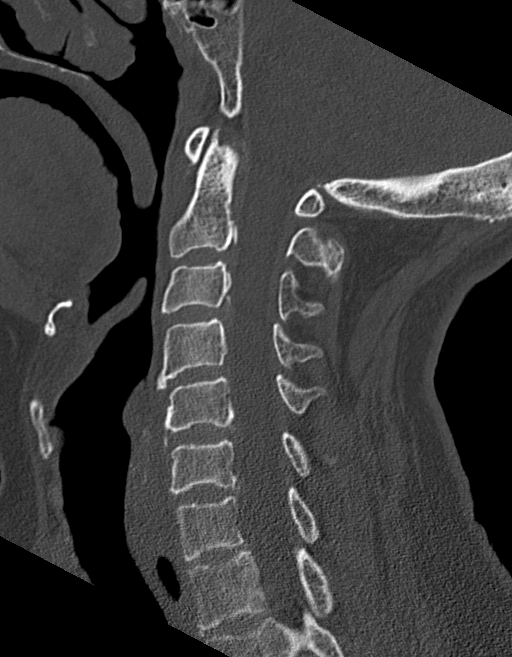
[im 47/71  bone]
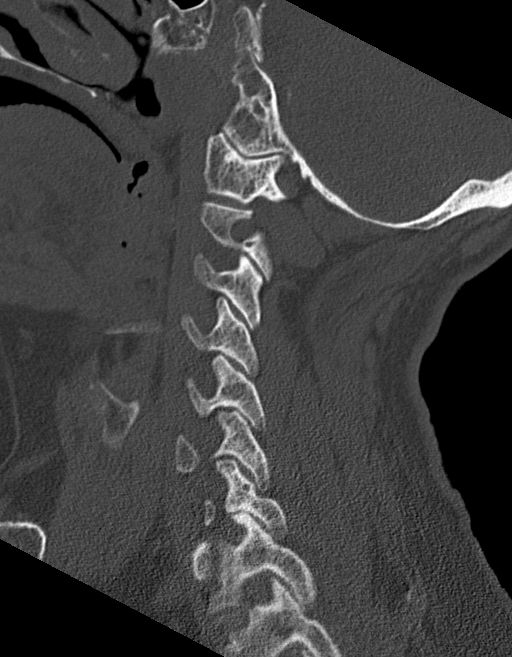
[im 59/71  bone]
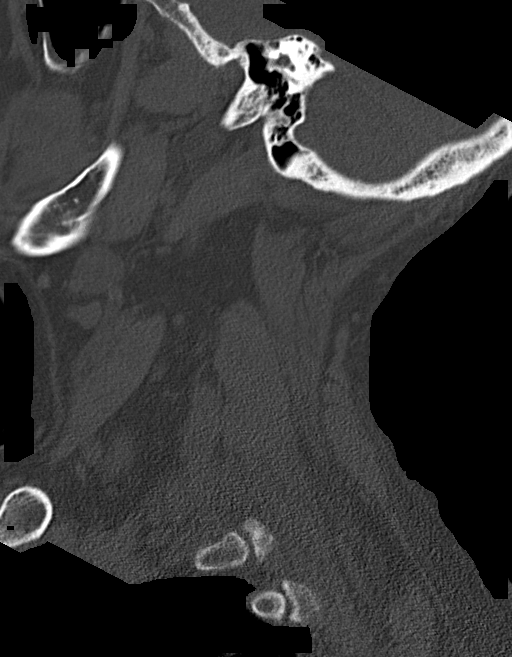

[Series 13: coronal bone · coronal · 0.31mm/px · 3 of 70 slices shown]
[im 18/70  bone]
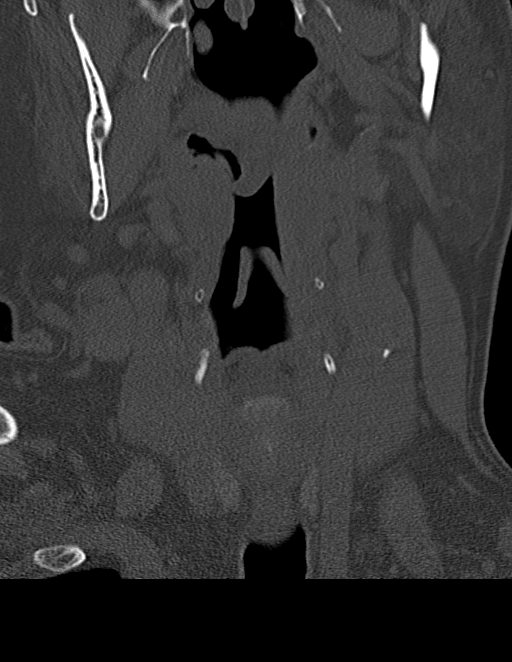
[im 35/70  bone]
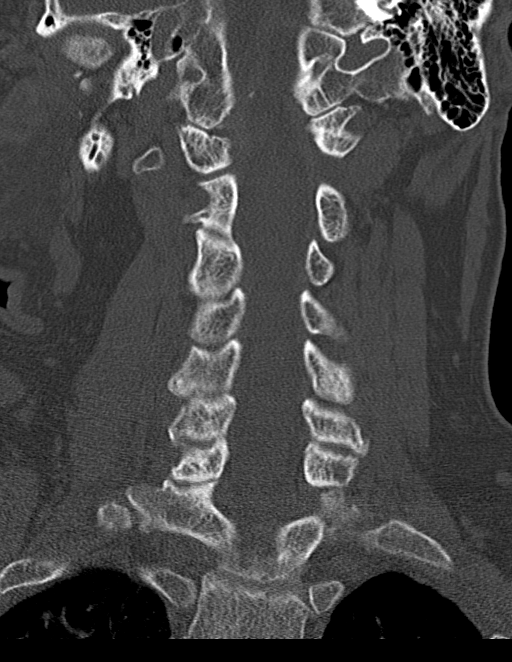
[im 52/70  bone]
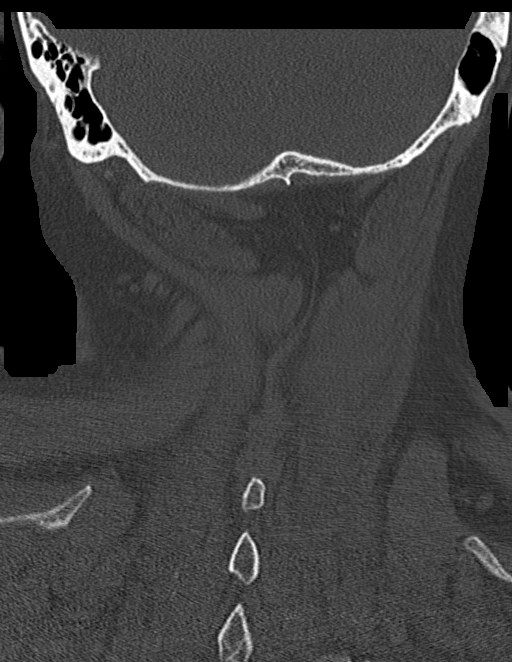

[Series 14: orthogonal bone · axial · 0.29mm/px · z∈[-253,-193]mm · 2 of 106 slices shown, 3 images]
[im 36/106  soft-tissue]
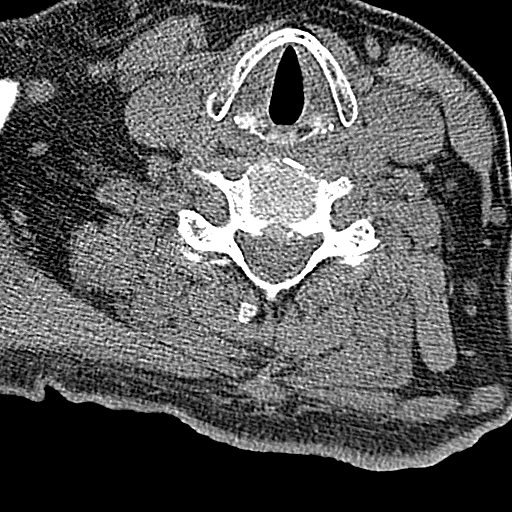
[im 36/106  bone]
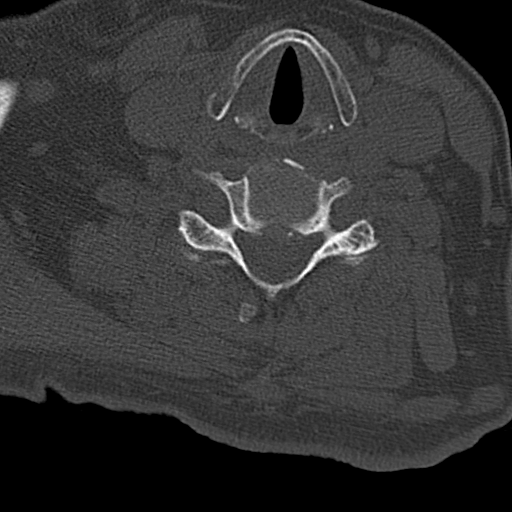
[im 71/106  bone]
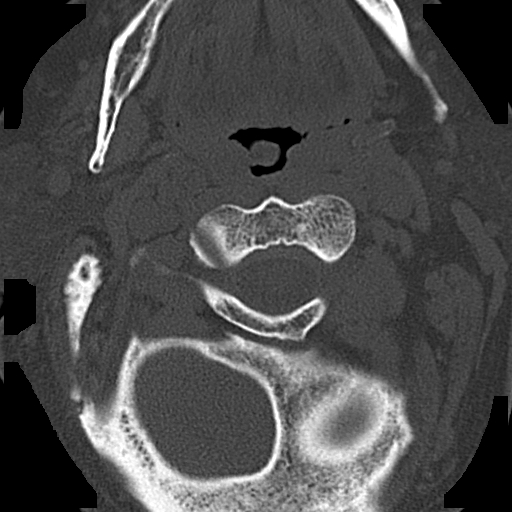

[10 of 33 positions shown; findings below may reference images not displayed]

FINDINGS: CT HEAD FINDINGS

Brain: No evidence of acute infarction, hemorrhage, hydrocephalus,
extra-axial collection or mass lesion/mass effect. Mild brain
parenchymal volume loss.

Vascular: Calcific atherosclerotic disease at the skullbase.

Skull: Choose were

Sinuses/Orbits: Polypoid mucosal thickening of the right maxillary
sinus. Sinuses and mastoid air cells otherwise well aerated.

Other: Left orbit postsurgical changes.

CT CERVICAL SPINE FINDINGS

Alignment: Normal.

Skull base and vertebrae: No acute fracture. No primary bone lesion
or focal pathologic process.

Soft tissues and spinal canal: No prevertebral fluid or swelling. No
visible canal hematoma.

Disc levels: Mild multilevel osteoarthritic changes of the cervical
spine.

Upper chest: 5 mm soft tissue pulmonary nodule in the right apex.

Other: None.
IMPRESSION: No evidence of acute intracranial abnormality.

Mild brain parenchymal volume loss.

No evidence of acute traumatic injury to cervical spine.

Right maxillary chronic sinusitis.

5 mm soft tissue pulmonary nodules in the right apex. No follow-up
needed if patient is low-risk. Non-contrast chest CT can be
considered in 12 months if patient is high-risk. This recommendation
follows the consensus statement: Guidelines for Management of
Incidental Pulmonary Nodules Detected on CT Images: From the
